# Patient Record
Sex: Male | Born: 1965
Health system: Southern US, Community
[De-identification: ages and names within clinical notes are randomized; demographics above are authoritative.]

## PROBLEM LIST (undated history)

## (undated) DIAGNOSIS — R519 Headache, unspecified: Secondary | ICD-10-CM

## (undated) DIAGNOSIS — I10 Essential (primary) hypertension: Secondary | ICD-10-CM

## (undated) DIAGNOSIS — F329 Major depressive disorder, single episode, unspecified: Secondary | ICD-10-CM

## (undated) DIAGNOSIS — E119 Type 2 diabetes mellitus without complications: Secondary | ICD-10-CM

## (undated) DIAGNOSIS — G459 Transient cerebral ischemic attack, unspecified: Secondary | ICD-10-CM

## (undated) DIAGNOSIS — G4730 Sleep apnea, unspecified: Secondary | ICD-10-CM

## (undated) DIAGNOSIS — F32A Depression, unspecified: Secondary | ICD-10-CM

## (undated) DIAGNOSIS — E7800 Pure hypercholesterolemia, unspecified: Secondary | ICD-10-CM

---

## 2008-11-12 ENCOUNTER — Emergency Department (HOSPITAL_BASED_OUTPATIENT_CLINIC_OR_DEPARTMENT_OTHER): Admission: EM | Admit: 2008-11-12 | Discharge: 2008-11-13 | Payer: Self-pay | Admitting: Emergency Medicine

## 2008-11-12 ENCOUNTER — Ambulatory Visit: Payer: Self-pay | Admitting: Diagnostic Radiology

## 2008-11-16 ENCOUNTER — Ambulatory Visit: Payer: Self-pay | Admitting: Diagnostic Radiology

## 2008-11-16 ENCOUNTER — Ambulatory Visit (HOSPITAL_BASED_OUTPATIENT_CLINIC_OR_DEPARTMENT_OTHER): Admission: RE | Admit: 2008-11-16 | Discharge: 2008-11-16 | Payer: Self-pay | Admitting: Emergency Medicine

## 2010-11-08 LAB — CBC
HCT: 42 % (ref 39.0–52.0)
Hemoglobin: 14 g/dL (ref 13.0–17.0)
MCHC: 33.2 g/dL (ref 30.0–36.0)
MCV: 85 fL (ref 78.0–100.0)
Platelets: 225 10*3/uL (ref 150–400)
RBC: 4.95 MIL/uL (ref 4.22–5.81)
RDW: 13.1 % (ref 11.5–15.5)
WBC: 9.9 10*3/uL (ref 4.0–10.5)

## 2010-11-08 LAB — POCT CARDIAC MARKERS
CKMB, poc: 1 ng/mL (ref 1.0–8.0)
CKMB, poc: <1 ng/mL — ABNORMAL LOW (ref 1.0–8.0)
Myoglobin, poc: 42.7 ng/mL (ref 12–200)
Myoglobin, poc: 48.5 ng/mL (ref 12–200)
Troponin i, poc: <0.05 ng/mL (ref 0.00–0.09)
Troponin i, poc: <0.05 ng/mL (ref 0.00–0.09)

## 2010-11-08 LAB — DIFFERENTIAL
Eosinophils Absolute: 0.4 10*3/uL (ref 0.0–0.7)
Eosinophils Relative: 4 % (ref 0–5)
Lymphs Abs: 2.9 10*3/uL (ref 0.7–4.0)
Monocytes Relative: 7 % (ref 3–12)

## 2010-11-08 LAB — BASIC METABOLIC PANEL
Chloride: 101 mEq/L (ref 96–112)
GFR calc Af Amer: >60 mL/min (ref >60)
Potassium: 3.5 mEq/L (ref 3.5–5.1)

## 2017-06-05 ENCOUNTER — Encounter (HOSPITAL_BASED_OUTPATIENT_CLINIC_OR_DEPARTMENT_OTHER): Payer: Self-pay | Admitting: *Deleted

## 2017-06-05 ENCOUNTER — Emergency Department (HOSPITAL_BASED_OUTPATIENT_CLINIC_OR_DEPARTMENT_OTHER): Payer: BC Managed Care – PPO

## 2017-06-05 ENCOUNTER — Emergency Department (HOSPITAL_BASED_OUTPATIENT_CLINIC_OR_DEPARTMENT_OTHER)
Admission: EM | Admit: 2017-06-05 | Discharge: 2017-06-05 | Disposition: A | Discharge status: Home / Self Care | Payer: BC Managed Care – PPO | Attending: Emergency Medicine | Admitting: Emergency Medicine

## 2017-06-05 DIAGNOSIS — Z7982 Long term (current) use of aspirin: Secondary | ICD-10-CM | POA: Diagnosis not present

## 2017-06-05 DIAGNOSIS — N3001 Acute cystitis with hematuria: Secondary | ICD-10-CM | POA: Diagnosis not present

## 2017-06-05 DIAGNOSIS — R1032 Left lower quadrant pain: Secondary | ICD-10-CM | POA: Diagnosis present

## 2017-06-05 DIAGNOSIS — N200 Calculus of kidney: Secondary | ICD-10-CM | POA: Diagnosis not present

## 2017-06-05 DIAGNOSIS — Z79899 Other long term (current) drug therapy: Secondary | ICD-10-CM | POA: Insufficient documentation

## 2017-06-05 DIAGNOSIS — E7800 Pure hypercholesterolemia, unspecified: Secondary | ICD-10-CM | POA: Diagnosis not present

## 2017-06-05 HISTORY — DX: Major depressive disorder, single episode, unspecified: F32.9

## 2017-06-05 HISTORY — DX: Pure hypercholesterolemia, unspecified: E78.00

## 2017-06-05 HISTORY — DX: Depression, unspecified: F32.A

## 2017-06-05 LAB — URINALYSIS, ROUTINE W REFLEX MICROSCOPIC
Bilirubin Urine: NEGATIVE
Glucose, UA: NEGATIVE mg/dL
KETONES UR: NEGATIVE mg/dL
LEUKOCYTES UA: NEGATIVE
NITRITE: NEGATIVE
PH: 6 (ref 5.0–8.0)
Protein, ur: 100 mg/dL — AB

## 2017-06-05 LAB — URINALYSIS, MICROSCOPIC (REFLEX)

## 2017-06-05 LAB — BASIC METABOLIC PANEL
ANION GAP: 9 (ref 5–15)
BUN: 13 mg/dL (ref 6–20)
CHLORIDE: 106 mmol/L (ref 101–111)
CO2: 22 mmol/L (ref 22–32)
Calcium: 9 mg/dL (ref 8.9–10.3)
Creatinine, Ser: 0.92 mg/dL (ref 0.61–1.24)
GFR calc non Af Amer: >60 mL/min (ref >60)
Glucose, Bld: 135 mg/dL — ABNORMAL HIGH (ref 65–99)
POTASSIUM: 4.1 mmol/L (ref 3.5–5.1)
Sodium: 137 mmol/L (ref 135–145)

## 2017-06-05 MED ORDER — TAMSULOSIN HCL 0.4 MG PO CAPS
0.4000 mg | ORAL_CAPSULE | Freq: Every day | ORAL | Status: DC
  Administered 2017-06-05: 0.4 mg via ORAL
  Filled 2017-06-05: qty 1

## 2017-06-05 MED ORDER — HYDROCODONE-ACETAMINOPHEN 5-325 MG PO TABS: 1.0000 | ORAL_TABLET | Freq: Four times a day (QID) | ORAL | 0 refills | Status: DC | PRN

## 2017-06-05 MED ORDER — ONDANSETRON HCL 4 MG/2ML IJ SOLN
4.0000 mg | Freq: Once | INTRAMUSCULAR | Status: AC
  Administered 2017-06-05: 4 mg via INTRAVENOUS
  Filled 2017-06-05: qty 2

## 2017-06-05 MED ORDER — CEPHALEXIN 250 MG PO CAPS
500.0000 mg | ORAL_CAPSULE | Freq: Once | ORAL | Status: AC
  Administered 2017-06-05: 500 mg via ORAL
  Filled 2017-06-05: qty 2

## 2017-06-05 MED ORDER — SODIUM CHLORIDE 0.9 % IV BOLUS (SEPSIS)
500.0000 mL | Freq: Once | INTRAVENOUS | Status: AC
  Administered 2017-06-05: 500 mL via INTRAVENOUS

## 2017-06-05 MED ORDER — CEPHALEXIN 500 MG PO CAPS: 500.0000 mg | ORAL_CAPSULE | Freq: Four times a day (QID) | ORAL | 0 refills | Status: DC

## 2017-06-05 MED ORDER — KETOROLAC TROMETHAMINE 30 MG/ML IJ SOLN
15.0000 mg | Freq: Once | INTRAMUSCULAR | Status: AC
  Administered 2017-06-05: 15 mg via INTRAVENOUS
  Filled 2017-06-05: qty 1

## 2017-06-05 MED ORDER — FENTANYL CITRATE (PF) 100 MCG/2ML IJ SOLN: 50.0000 ug | Freq: Once | INTRAMUSCULAR | Status: DC

## 2017-06-05 MED ORDER — TAMSULOSIN HCL 0.4 MG PO CAPS: 0.4000 mg | ORAL_CAPSULE | Freq: Every day | ORAL | 0 refills | Status: AC

## 2017-06-05 MED ORDER — DICLOFENAC SODIUM ER 100 MG PO TB24: 100.0000 mg | ORAL_TABLET | Freq: Every day | ORAL | 0 refills | Status: DC

## 2017-06-05 MED FILL — CEPHALEXIN 500 MG CAPSULE: 500 | 7 days supply | Qty: 28 | Fill #0

## 2017-06-05 MED FILL — TAMSULOSIN HCL 0.4 MG CAP: 0.4 | 30 days supply | Qty: 30 | Fill #0

## 2017-06-05 MED FILL — HYDROCODON-APAP 5-325: 5-325 | 3 days supply | Qty: 11 | Fill #0

## 2017-06-05 MED FILL — DICLOFENAC SOD ER 100 MG TA: 100 | 10 days supply | Qty: 10 | Fill #0

## 2017-06-05 NOTE — ED Provider Notes (Signed)
[facility] EMERGENCY DEPARTMENT Provider Note   CSN: [id number] Arrival date & time: 06/05/17  0531     History   Chief Complaint Chief Complaint  Patient presents with  . Abdominal Pain    HPI Nathaniel Hall is a 51 y.o. male.  The history is provided by the patient.  Abdominal Pain   This is a recurrent problem. The current episode started less than 1 hour ago. The problem occurs constantly. The problem has not changed since onset.The pain is associated with an unknown factor. The pain is located in the LLQ. The pain is severe. Pertinent negatives include fever, diarrhea, nausea, dysuria and myalgias. Nothing aggravates the symptoms. Nothing relieves the symptoms. Past workup includes GI consult. His past medical history does not include PUD or ulcerative colitis.    Past Medical History:  Diagnosis Date  . Depression   . Hypercholesteremia     There are no active problems to display for this patient.   History reviewed. No pertinent surgical history.     Home Medications    Prior to Admission medications   Medication Sig Start Date End Date Taking? Authorizing Provider  aspirin 81 MG chewable tablet Chew 81 mg daily by mouth.   Yes [provider]  atorvastatin (LIPITOR) 10 MG tablet Take 10 mg daily by mouth.   Yes [provider]  citalopram (CELEXA) 40 MG tablet Take 40 mg daily by mouth.   Yes [provider]    Family History No family history on file.  Social History Social History   Tobacco Use  . Smoking status: Never Smoker  . Smokeless tobacco: Never Used  Substance Use Topics  . Alcohol use: No    Frequency: Never  . Drug use: No     Allergies   Percocet [oxycodone-acetaminophen]   Review of Systems Review of Systems  Constitutional: Negative for fever.  Respiratory: Negative for shortness of breath.   Cardiovascular: Negative for chest pain.  Gastrointestinal: Positive for abdominal  pain. Negative for diarrhea and nausea.  Genitourinary: Negative for dysuria.  Musculoskeletal: Negative for myalgias.  All other systems reviewed and are negative.    Physical Exam Updated Vital Signs BP (!) 156/74 (BP Location: Right Arm)   Pulse 78   Temp (!) 97.4 F (36.3 C) (Oral)   Resp (!) 24   Ht 5\' 11"  (1.803 m)   Wt 106.6 kg (235 lb)   SpO2 100%   BMI 32.78 kg/m   Physical Exam  Constitutional: He is oriented to person, place, and time. He appears well-developed and well-nourished.  Non-toxic appearance. He does not appear ill. No distress.  HENT:  Head: Normocephalic and atraumatic.  Mouth/Throat: Oropharynx is clear and moist.  Eyes: Pupils are equal, round, and reactive to light.  Cardiovascular: Normal rate, regular rhythm, normal heart sounds and intact distal pulses.  Pulmonary/Chest: Effort normal and breath sounds normal. No stridor. He has no rales.  Abdominal: Soft. Normal appearance and bowel sounds are normal. There is no tenderness. There is no rigidity, no guarding, no tenderness at McBurney's point and negative Murphy's sign. No hernia.  Neurological: He is alert and oriented to person, place, and time.  Skin: Skin is warm and dry. Capillary refill takes less than 2 seconds.  Psychiatric: He has a normal mood and affect.  Nursing note and vitals reviewed.    ED Treatments / Results  Labs (all labs ordered are listed, but only abnormal results are displayed) Labs Reviewed  BASIC METABOLIC PANEL    EKG  EKG Interpretation None       Radiology No results found.  Procedures Procedures (including critical care time)  Medications Ordered in ED Medications  fentaNYL (SUBLIMAZE) injection 50 mcg (not administered)  ondansetron (ZOFRAN) injection 4 mg (4 mg Intravenous Given 06/05/17 0603)  ketorolac (TORADOL) 30 MG/ML injection 15 mg (15 mg Intravenous Given 06/05/17 0603)  sodium chloride 0.9 % bolus 500 mL (500 mLs Intravenous New  Bag/Given 06/05/17 0559)     645 case d/w Dr. Mena GoesEskridge start abx and given return precautions and have patient follow up in the office.    Final Clinical Impressions(s) / ED Diagnoses  All questions answered to the patient's satisfaction.   Strict return precautions for swelling or the lips or tongue, chest pain, dyspnea on exertion, new weakness or numbness changes in vision or speech, fevers, weakness persistent pain, Inability to tolerate liquids or food, changes in voice cough, altered mental status or any concerns. No signs of systemic illness or infection. The patient is nontoxic-appearing on exam and vital signs are within normal limits.    I have reviewed the triage vital signs and the nursing notes. Pertinent labs &imaging results that were available during my care of the patient were reviewed by me and considered in my medical decision making (see chart for details).  After history, exam, and medical workup I feel the patient has been appropriately medically screened and is safe for discharge home. Pertinent diagnoses were discussed with the patient. Patient was given return precautions.   Ashad Fawbush, MD 06/05/17 [phone]

## 2017-06-05 NOTE — ED Triage Notes (Signed)
Pt woke x 20 min PTA with LLQ pain

## 2017-06-08 ENCOUNTER — Encounter (HOSPITAL_BASED_OUTPATIENT_CLINIC_OR_DEPARTMENT_OTHER): Payer: Self-pay | Admitting: Emergency Medicine

## 2018-10-12 IMAGING — CT CT RENAL STONE PROTOCOL
2 of 4 series · 16 of 46 positions shown, 18 images · non-contrast
Comparison: None.

CLINICAL DATA: Both lower quadrant pain

EXAM:
CT ABDOMEN AND PELVIS WITHOUT CONTRAST
TECHNIQUE: Multidetector CT imaging of the abdomen and pelvis was performed
following the standard protocol without IV contrast.

[Series 2: axial st · axial · 0.96mm/px · z∈[-484,-29]mm · 13 of 101 slices shown, 15 images]
[im 5/101  soft-tissue]
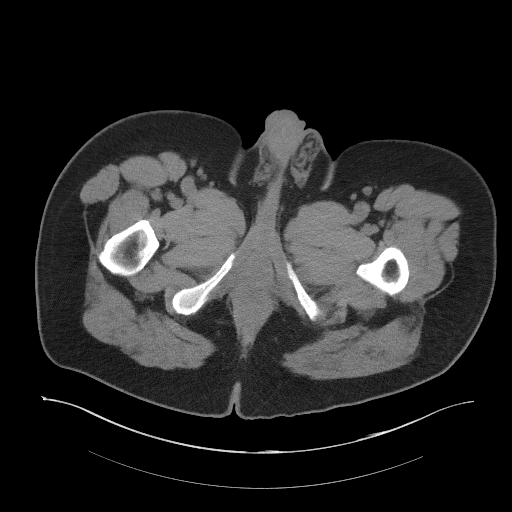
[im 5/101  bone]
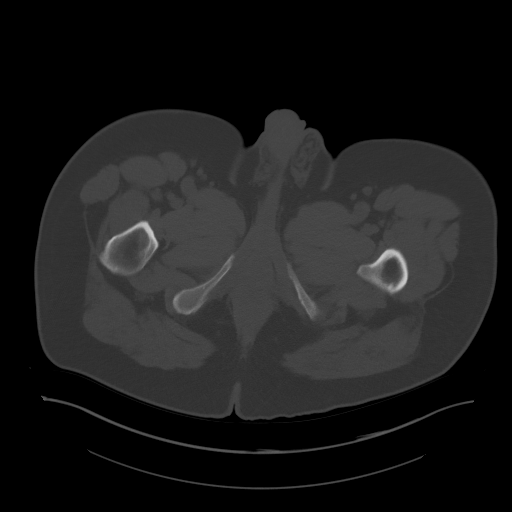
[im 13/101  soft-tissue]
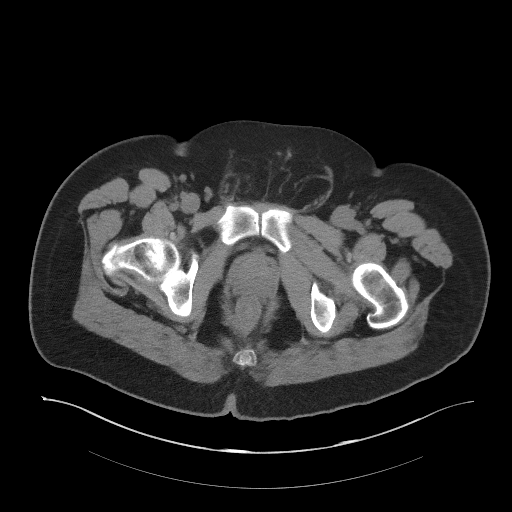
[im 21/101  soft-tissue]
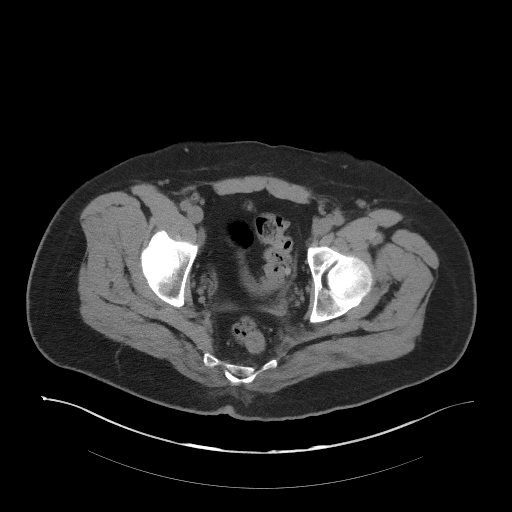
[im 30/101  soft-tissue]
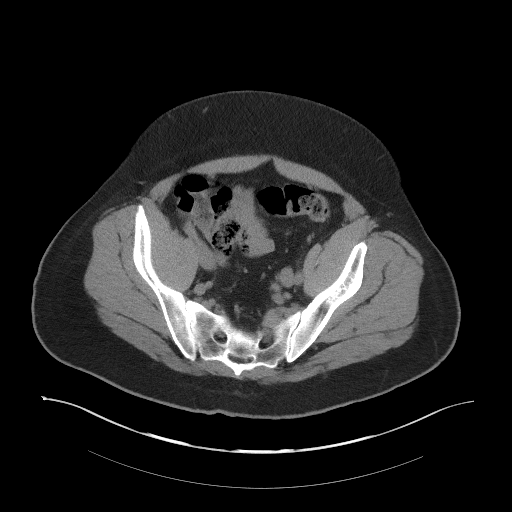
[im 34/101  soft-tissue]
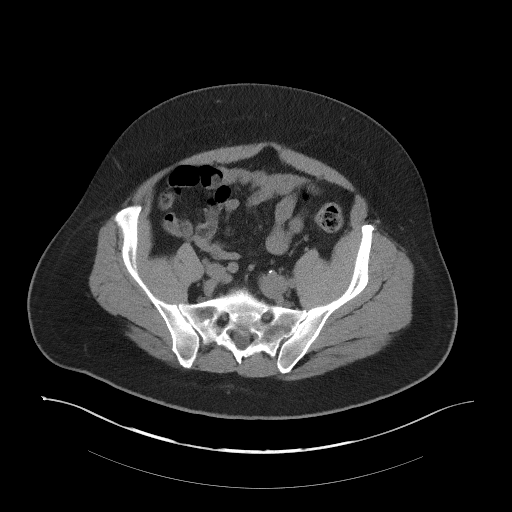
[im 42/101  soft-tissue]
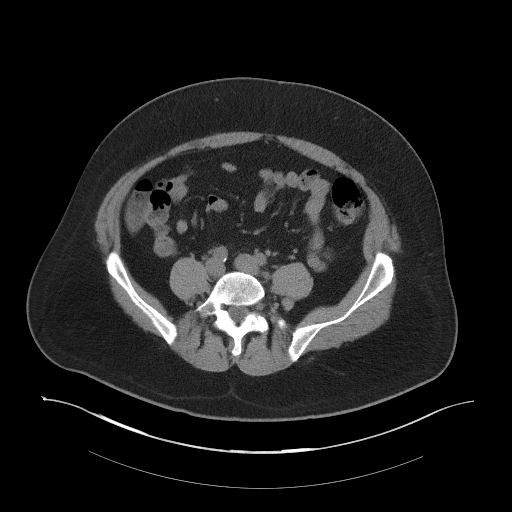
[im 51/101  soft-tissue]
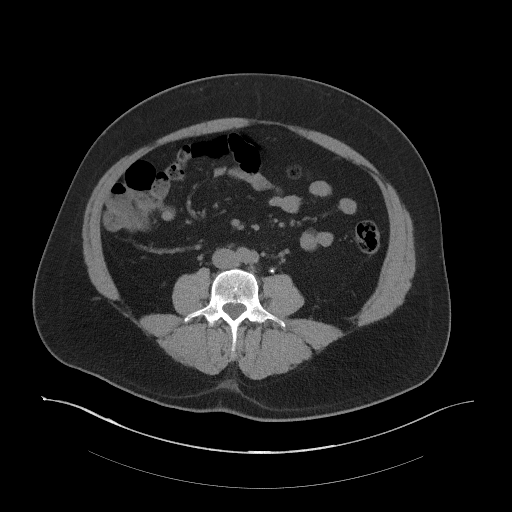
[im 59/101  soft-tissue]
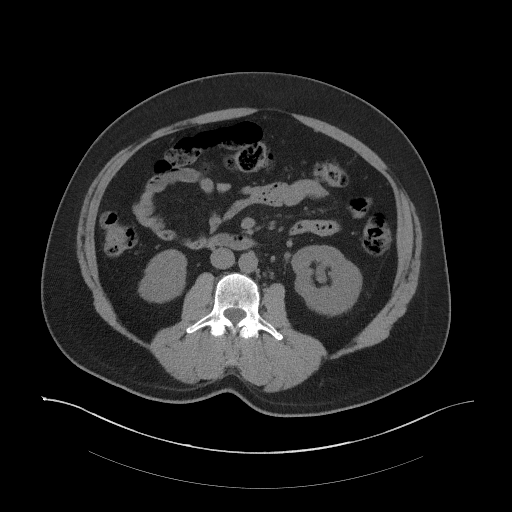
[im 67/101  soft-tissue]
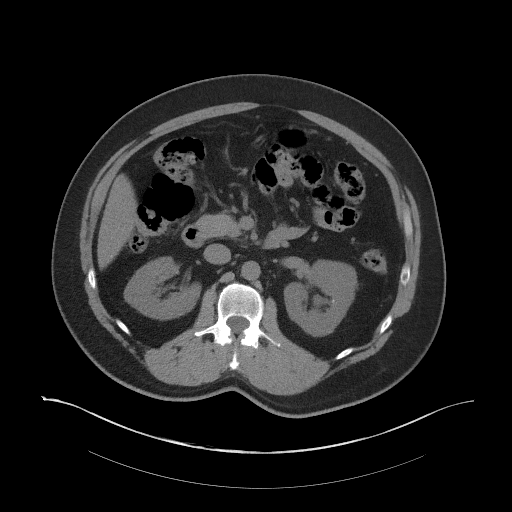
[im 67/101  bone]
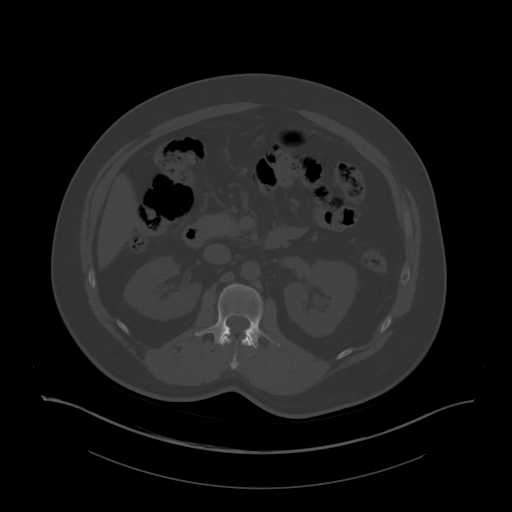
[im 71/101  soft-tissue]
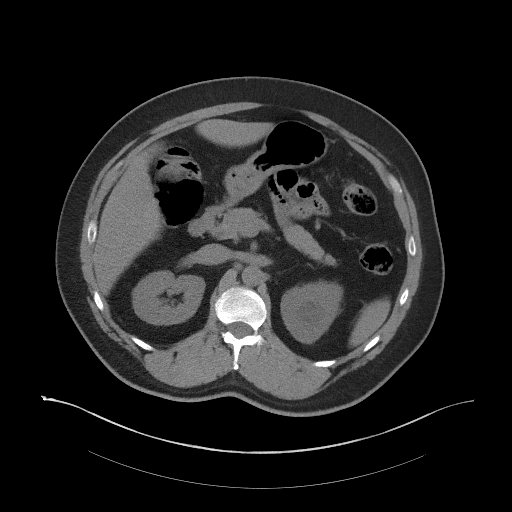
[im 80/101  soft-tissue]
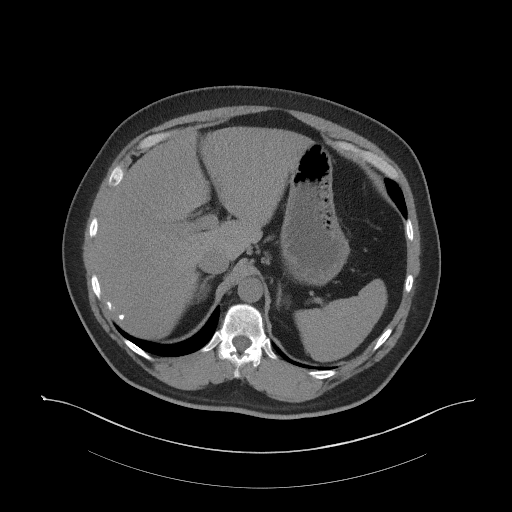
[im 88/101  soft-tissue]
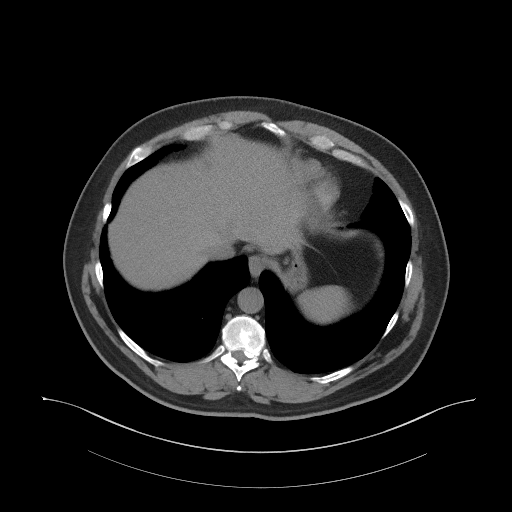
[im 96/101  soft-tissue]
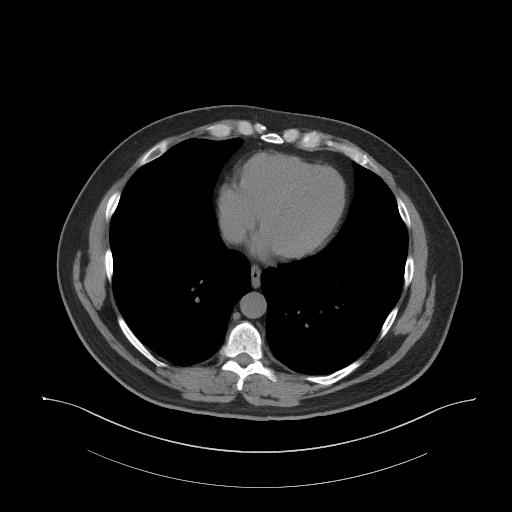

[Series 4: coronal st · coronal · 0.94mm/px · 3 of 89 slices shown]
[im 30/89  soft-tissue]
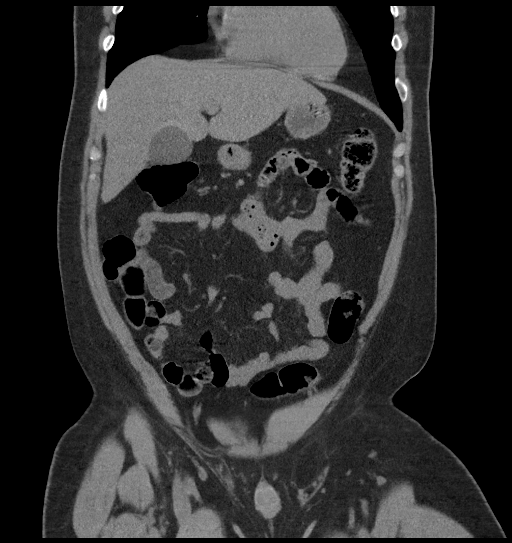
[im 40/89  soft-tissue]
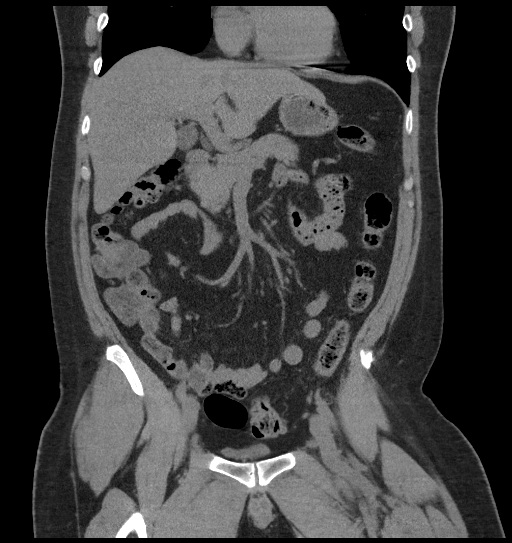
[im 49/89  soft-tissue]
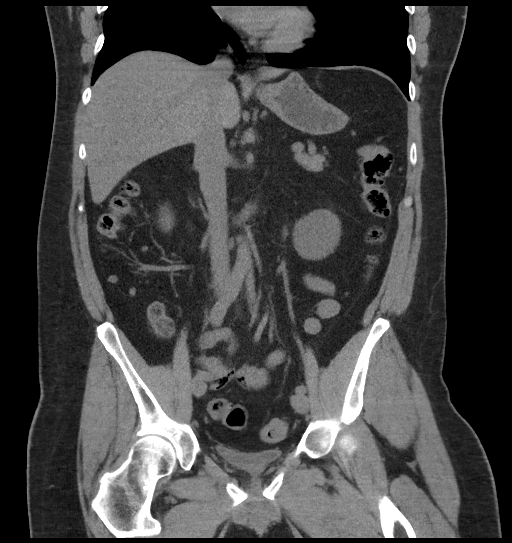

[16 of 46 positions shown; findings below may reference images not displayed]

FINDINGS: Lower chest: No pulmonary nodules or pleural effusion. No visible
pericardial effusion.

Hepatobiliary: Normal hepatic contours and density. No visible
biliary dilatation. Normal gallbladder.

Pancreas: Normal contours without ductal dilatation. No
peripancreatic fluid collection.

Spleen: Normal.

Adrenals/Urinary Tract:

--Adrenal glands: Normal.

--Right kidney/ureter: No hydronephrosis or perinephric stranding.
No nephrolithiasis. No obstructing ureteral stones.

--Left kidney/ureter: There is a 3 x 2 mm stone within the
midportion of the left ureter with resultant mild left
hydronephrosis. The remainder of the left ureter is normal. There
are additional nonobstructing stones within the left kidney
measuring 4 mm in the interpolar region and 3 mm at the upper
portion of the renal pelvis. 4.7 cm left upper pole cyst.

--Urinary bladder: Unremarkable.

Stomach/Bowel:

--Stomach/Duodenum: Normal.

--Small bowel: No dilatation or inflammation.

--Colon: No focal abnormality.

--Appendix: Normal.

Vascular/Lymphatic: Normal course and caliber of the major abdominal
vessels. No abdominal or pelvic lymphadenopathy.

Reproductive: Normal prostate and seminal vesicles.

Musculoskeletal. Status post T10 vertebral augmentation.

Other: None.
IMPRESSION: 1. Left-sided obstructive uropathy with mid ureteral stone measuring
3 x 2 mm causing mild left hydronephrosis.
2. Additional, nonobstructing left renal calculi measuring up to 4
mm.

## 2019-10-08 ENCOUNTER — Ambulatory Visit: Payer: BC Managed Care – PPO | Attending: Internal Medicine

## 2019-10-08 DIAGNOSIS — Z23 Encounter for immunization: Secondary | ICD-10-CM

## 2019-10-08 NOTE — Progress Notes (Signed)
   Covid-19 Vaccination Clinic  Name:  Nathaniel Hall    MRN: [id number] DOB: 24-Oct-1965  10/08/2019  Nathaniel Hall was observed post Covid-19 immunization for 15 minutes without incident. He was provided with Vaccine Information Sheet and instruction to access the V-Safe system.   Nathaniel Hall was instructed to call 911 with any severe reactions post vaccine: Marland Kitchen Difficulty breathing  . Swelling of face and throat  . A fast heartbeat  . A bad rash all over body  . Dizziness and weakness   Immunizations Administered    Name Date Dose VIS Date Route   Pfizer COVID-19 Vaccine 10/08/2019  1:37 PM 0.3 mL 07/10/2019 Intramuscular   Manufacturer: [employer], [employer]   Lot: [id number]   NDC: [address]-7

## 2019-11-02 ENCOUNTER — Ambulatory Visit: Payer: BC Managed Care – PPO | Attending: Internal Medicine

## 2019-11-02 DIAGNOSIS — Z23 Encounter for immunization: Secondary | ICD-10-CM

## 2019-11-02 NOTE — Progress Notes (Signed)
   Covid-19 Vaccination Clinic  Name:  Nathaniel Hall    MRN: [id number] DOB: June 21, 1966  11/02/2019  Nathaniel Hall was observed post Covid-19 immunization for 15 minutes without incident. He was provided with Vaccine Information Sheet and instruction to access the V-Safe system.   Nathaniel Hall was instructed to call 911 with any severe reactions post vaccine: Marland Kitchen Difficulty breathing  . Swelling of face and throat  . A fast heartbeat  . A bad rash all over body  . Dizziness and weakness   Immunizations Administered    Name Date Dose VIS Date Route   Pfizer COVID-19 Vaccine 11/02/2019 11:27 AM 0.3 mL 07/10/2019 Intramuscular   Manufacturer: [employer], [employer]   Lot: [phone] [id number]   NDC: [address]-4

## 2020-03-04 ENCOUNTER — Other Ambulatory Visit: Payer: Self-pay | Admitting: Urology

## 2020-03-04 ENCOUNTER — Other Ambulatory Visit (HOSPITAL_COMMUNITY)
Admission: RE | Admit: 2020-03-04 | Discharge: 2020-03-04 | Disposition: A | Discharge status: Home / Self Care | Payer: BC Managed Care – PPO | Source: Ambulatory Visit | Attending: Urology | Admitting: Urology

## 2020-03-04 DIAGNOSIS — Z01812 Encounter for preprocedural laboratory examination: Secondary | ICD-10-CM | POA: Diagnosis present

## 2020-03-04 DIAGNOSIS — Z20822 Contact with and (suspected) exposure to covid-19: Secondary | ICD-10-CM | POA: Diagnosis not present

## 2020-03-04 LAB — SARS CORONAVIRUS 2 (TAT 6-24 HRS): SARS Coronavirus 2: NEGATIVE

## 2020-03-04 NOTE — H&P (Signed)
Office Visit Report     03/03/2020   --------------------------------------------------------------------------------   Nathaniel Hall  MRN: [id number]  DOB: 09/14/1965, 54 year old Male  SSN:    PRIMARY CARE:  [facility]  REFERRING:  April K. Terressa Koyanagi, MD  PROVIDER:  Anne Fu, NP  LOCATION:  [facility] [phone]     --------------------------------------------------------------------------------   CC/HPI: 02/24/2020:  Nathaniel Hall is a 54 year old patient of Dr. Mena Goes. He has a known history of bilateral nonobstructing renal stones based on recent ultrasonography in May. He also underwent cystoscopy earlier this month for microscopic hematuria at which time he had 3-10 red blood cells. No significant pathology was noted on cystoscopy it was felt that his hematuria was likely related to his stone disease. Over the last 48 hours, he has developed severe right lower quadrant pain. This has been associated with nausea. He has had low-grade temperature to 99 but no high-grade fever.   03/03/2020: CT imaging performed at time of last office visit indicated an obstructing right mid ureteral calculi. Patient was interested in shockwave lithotripsy for treatment but due to stones location it was thought this was not an appropriate treatment modality. Medical expulsive therapy initiated. He returns today for follow-up exam with KUB.   He has not seen any interval stone material passage. He does continue tamsulosin and notes an improvement with force of stream in regards to this. No significantly worsening of baseline frequency/urgency. He has not had any interval burning or painful urination, visible blood in the urine. Pain and discomfort as well as nausea have continue much less severe more intermittent not requiring the use of any type of antiemetic or pain medication since time of last office visit. He denies interval fevers or chills.     ALLERGIES: Percocet  - "makes pain worse per pt"    MEDICATIONS: Hydrocodone-Acetaminophen 5 mg-325 mg tablet 1-2 tablet PO Q 6 H prn  Metoprolol Tartrate 25 mg tablet  Tamsulosin Hcl 0.4 mg capsule 1 capsule PO Q HS  Tamsulosin Hcl 0.4 mg capsule  Aspirin Ec 81 mg tablet, delayed release  Duloxetine Hcl 60 mg capsule,delayed release  Indapamide 1.25 mg tablet  Relpax PRN  Vitamin D3     GU PSH: Cystoscopy - 02/04/2020       PSH Notes: oral surgery     NON-GU PSH: Back surgery Colonoscopy     GU PMH: RLQ pain - 02/24/2020 Ureteral calculus - 02/24/2020 Microscopic hematuria, benign eval - 02/04/2020 Gross hematuria - 12/09/2019 Pelvic/perineal pain - 10/23/2019, - 04/08/2019, - 03/06/2019, - 02/17/2019, - 02/02/2019 Microscopic hematuria - 02/02/2019 Renal calculus - 2018 Renal and ureteral calculus, Left - 2018 Renal cyst - 2018      PMH Notes: hepatitis     NON-GU PMH: Hypercalciuria - 2018 Anxiety Depression Hypercholesterolemia Other migraine, intractable, with status migrainosus Sleep Apnea Stroke/TIA    FAMILY HISTORY: Death - Mother Heart Attack - Mother, Father skin cancer - Mother   SOCIAL HISTORY: Marital Status: Married Preferred Language: English; Ethnicity: Not Hispanic Or Latino; Race: White Current Smoking Status: Patient has never smoked.   Tobacco Use Assessment Completed: Used Tobacco in last 30 days? Drinks 1 drink per month.  Drinks 1 caffeinated drink per day. 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     Notes: 2 daughters    REVIEW OF SYSTEMS:    GU Review Male:   Patient reports stream starts and stops and trouble starting your stream. Patient denies frequent urination, hard to  postpone urination, burning/ pain with urination, get up at night to urinate, leakage of urine, have to strain to urinate , erection problems, and penile pain.  Gastrointestinal (Upper):   Patient reports nausea. Patient denies vomiting and indigestion/ heartburn.  Gastrointestinal (Lower):    Patient denies diarrhea and constipation.  Constitutional:   Patient denies fever, night sweats, weight loss, and fatigue.  Skin:   Patient denies itching and skin rash/ lesion.  Eyes:   Patient denies blurred vision and double vision.  Ears/ Nose/ Throat:   Patient denies sore throat and sinus problems.  Hematologic/Lymphatic:   Patient denies swollen glands and easy bruising.  Cardiovascular:   Patient denies leg swelling and chest pains.  Respiratory:   Patient denies cough and shortness of breath.  Endocrine:   Patient denies excessive thirst.  Musculoskeletal:   Patient reports joint pain. Patient denies back pain.  Neurological:   Patient reports headaches and dizziness.   Psychologic:   Patient denies depression and anxiety.   VITAL SIGNS:      03/03/2020 09:20 AM  Weight 238 lb / 107.95 kg  Height 71 in / 180.34 cm  BP 135/84 mmHg  Heart Rate 77 /min  Temperature 98.9 F / 37.1 C  BMI 33.2 kg/m   MULTI-SYSTEM PHYSICAL EXAMINATION:    Constitutional: Well-nourished. No physical deformities. Normally developed. Good grooming.  Neck: Neck symmetrical, not swollen. Normal tracheal position.  Respiratory: No labored breathing, no use of accessory muscles.   Cardiovascular: Normal temperature, normal extremity pulses, no swelling, no varicosities.  Skin: No paleness, no jaundice, no cyanosis. No lesion, no ulcer, no rash.  Neurologic / Psychiatric: Oriented to time, oriented to place, oriented to person. No depression, no anxiety, no agitation.  Gastrointestinal: Obese abdomen. No mass, no tenderness, no rigidity.   Musculoskeletal: Normal gait and station of head and neck.     Complexity of Data:  Source Of History:  Patient, Medical Record Summary  Records Review:   Previous Doctor Records, Previous Hospital Records, Previous Patient Records  Urine Test Review:   Urinalysis, Urine Culture  X-Ray Review: KUB: Reviewed Films. Discussed With Patient.  C.T. Stone Protocol:  Reviewed Films. Reviewed Report.     03/03/20  Urinalysis  Urine Appearance Clear   Urine Color Yellow   Urine Glucose Neg mg/dL  Urine Bilirubin Neg mg/dL  Urine Ketones Neg mg/dL  Urine Specific Gravity 1.025   Urine Blood Neg ery/uL  Urine pH 6.0   Urine Protein Trace mg/dL  Urine Urobilinogen 0.2 mg/dL  Urine Nitrites Neg   Urine Leukocyte Esterase Neg leu/uL   PROCEDURES:         KUB - [id number]  A single view of the abdomen is obtained. An opacity of similar size/shape of the previously identified right mid ureteral calculi is noted overlying the midportion of the right sacral wing. Grossly unchanged in location. The remaining anatomical expected tract of the right distal ureter is not well visualized due to a prominent overlying bowel gas and stool pattern within the pelvic ring. Additional ureteral calculi can't be positively identified distally due to this.      Patient confirmed No Neulasta OnPro Device.           Urinalysis Dipstick Dipstick Cont'd  Color: Yellow Bilirubin: Neg mg/dL  Appearance: Clear Ketones: Neg mg/dL  Specific Gravity: 4.481 Blood: Neg ery/uL  pH: 6.0 Protein: Trace mg/dL  Glucose: Neg mg/dL Urobilinogen: 0.2 mg/dL    Nitrites: Neg    Leukocyte  Esterase: Neg leu/uL    ASSESSMENT:      ICD-10 Details  1 GU:   Ureteral calculus - N20.1 Right, Acute, Systemic Symptoms  2   Renal calculus - N20.0 Left, Chronic, Stable   PLAN:           Orders Labs Urine Culture          Schedule Return Visit/Planned Activity: Other See Visit Notes - Follow up MD, Schedule Surgery          Document Letter(s):  Created for Patient: Clinical Summary         Notes:   Larina Bras remains plainly visible on KUB today overlying the right sacral wing. Fortunately he has only been intermittently, mildly symptomatic since time last office visit. He continues tamsulosin. Based on stone size I am not sure if patient will be able to successfully pass with continued medical  expulsive therapy. We discussed definitive treatment options. Based on the stones location overlying the bony structures, I have some reservations about proceeding with shockwave lithotripsy although that would be the patient's preference. We discussed ureteroscopy as well.   We discussed ureteroscopic stone manipulation with basketing and laser-lithotripsy in detail. We discussed risks including bleeding, infection, damage to kidney / ureter bladder, rarely loss of kidney. We discussed anesthetic risks and rare but serious surgical complications including DVT, PE, MI, and mortality. We specifically addressed that in 5-10% of cases a staged approach is required with stenting followed by re-attempt ureteroscopy if anatomy unfavorable.   I have sent a message to Dr Mena Goes for review. The patient will be contacted with recommended plan of care moving forward. He will continue tamsulosin, remaining liberally hydrated straining his urine. In the event he does pass the stone he will contact the office to be scheduled for further evaluation. He has pain medication on hand as well as antinausea medication to use if needed. Precautionary urine culture sent today to serve as baseline prior to pending follow-up or procedure intervention.    * Signed by Anne Fu, NP on 03/03/20 at 9:41 AM (EDT*     The information contained in this medical record document is considered private and confidential patient information. This information can only be used for the medical diagnosis and/or medical services that are being provided by the patient's selected caregivers. This information can only be distributed outside of the patient's care if the patient agrees and signs waivers of authorization for this information to be sent to an outside source or route.

## 2020-03-04 NOTE — Progress Notes (Signed)
Patient to arrive at 0915. History and medications reviewed. Pre-procedure instructions given. NPO after MN. Norvasc with sip of water in am. Driver secured.

## 2020-03-07 ENCOUNTER — Encounter (HOSPITAL_BASED_OUTPATIENT_CLINIC_OR_DEPARTMENT_OTHER)
Admission: RE | Disposition: A | Discharge status: Home / Self Care | Payer: Self-pay | Source: Home / Self Care | Attending: Urology

## 2020-03-07 ENCOUNTER — Ambulatory Visit (HOSPITAL_BASED_OUTPATIENT_CLINIC_OR_DEPARTMENT_OTHER)
Admission: RE | Admit: 2020-03-07 | Discharge: 2020-03-07 | Disposition: A | Discharge status: Home / Self Care | Payer: BC Managed Care – PPO | Attending: Urology | Admitting: Urology

## 2020-03-07 ENCOUNTER — Ambulatory Visit (HOSPITAL_COMMUNITY): Payer: BC Managed Care – PPO

## 2020-03-07 ENCOUNTER — Encounter (HOSPITAL_BASED_OUTPATIENT_CLINIC_OR_DEPARTMENT_OTHER): Payer: Self-pay | Admitting: Urology

## 2020-03-07 DIAGNOSIS — Z8249 Family history of ischemic heart disease and other diseases of the circulatory system: Secondary | ICD-10-CM | POA: Diagnosis not present

## 2020-03-07 DIAGNOSIS — G4730 Sleep apnea, unspecified: Secondary | ICD-10-CM | POA: Insufficient documentation

## 2020-03-07 DIAGNOSIS — F419 Anxiety disorder, unspecified: Secondary | ICD-10-CM | POA: Insufficient documentation

## 2020-03-07 DIAGNOSIS — Z87442 Personal history of urinary calculi: Secondary | ICD-10-CM | POA: Insufficient documentation

## 2020-03-07 DIAGNOSIS — N201 Calculus of ureter: Secondary | ICD-10-CM

## 2020-03-07 DIAGNOSIS — Z8673 Personal history of transient ischemic attack (TIA), and cerebral infarction without residual deficits: Secondary | ICD-10-CM | POA: Diagnosis not present

## 2020-03-07 DIAGNOSIS — Z885 Allergy status to narcotic agent status: Secondary | ICD-10-CM | POA: Diagnosis not present

## 2020-03-07 DIAGNOSIS — G43909 Migraine, unspecified, not intractable, without status migrainosus: Secondary | ICD-10-CM | POA: Insufficient documentation

## 2020-03-07 DIAGNOSIS — Z79899 Other long term (current) drug therapy: Secondary | ICD-10-CM | POA: Insufficient documentation

## 2020-03-07 DIAGNOSIS — I10 Essential (primary) hypertension: Secondary | ICD-10-CM | POA: Diagnosis not present

## 2020-03-07 DIAGNOSIS — Z7982 Long term (current) use of aspirin: Secondary | ICD-10-CM | POA: Diagnosis not present

## 2020-03-07 DIAGNOSIS — F329 Major depressive disorder, single episode, unspecified: Secondary | ICD-10-CM | POA: Diagnosis not present

## 2020-03-07 HISTORY — PX: EXTRACORPOREAL SHOCK WAVE LITHOTRIPSY: SHX1557

## 2020-03-07 SURGERY — LITHOTRIPSY, ESWL
Anesthesia: LOCAL | Laterality: Right

## 2020-03-07 MED ORDER — DIAZEPAM 5 MG PO TABS
ORAL_TABLET | ORAL | Status: AC
  Filled 2020-03-07: qty 2

## 2020-03-07 MED ORDER — DIAZEPAM 5 MG PO TABS
10.0000 mg | ORAL_TABLET | ORAL | Status: AC
  Administered 2020-03-07: 10 mg via ORAL

## 2020-03-07 MED ORDER — CIPROFLOXACIN HCL 500 MG PO TABS
500.0000 mg | ORAL_TABLET | ORAL | Status: AC
  Administered 2020-03-07: 500 mg via ORAL

## 2020-03-07 MED ORDER — DIPHENHYDRAMINE HCL 25 MG PO CAPS
25.0000 mg | ORAL_CAPSULE | ORAL | Status: AC
  Administered 2020-03-07: 25 mg via ORAL

## 2020-03-07 MED ORDER — DIPHENHYDRAMINE HCL 25 MG PO CAPS
ORAL_CAPSULE | ORAL | Status: AC
  Filled 2020-03-07: qty 1

## 2020-03-07 MED ORDER — CIPROFLOXACIN HCL 500 MG PO TABS
ORAL_TABLET | ORAL | Status: AC
  Filled 2020-03-07: qty 1

## 2020-03-07 MED ORDER — SODIUM CHLORIDE 0.9 % IV SOLN: INTRAVENOUS | Status: DC

## 2020-03-07 NOTE — Discharge Instructions (Signed)
Lithotripsy, Care After This sheet gives you information about how to care for yourself after your procedure. Your health care provider may also give you more specific instructions. If you have problems or questions, contact your health care provider. What can I expect after the procedure? After the procedure, it is common to have:  Some blood in your urine. This should only last for a few days.  Soreness in your back, sides, or upper abdomen for a few days.  Blotches or bruises on your back where the pressure wave entered the skin.  Pain, discomfort, or nausea when pieces (fragments) of the kidney stone move through the tube that carries urine from the kidney to the bladder (ureter). Stone fragments may pass soon after the procedure, but they may continue to pass for up to 4-8 weeks. ? If you have severe pain or nausea, contact your health care provider. This may be caused by a large stone that was not broken up, and this may mean that you need more treatment.  Some pain or discomfort during urination.  Some pain or discomfort in the lower abdomen or (in men) at the base of the penis. Follow these instructions at home: Medicines  Take over-the-counter and prescription medicines only as told by your health care provider.  If you were prescribed an antibiotic medicine, take it as told by your health care provider. Do not stop taking the antibiotic even if you start to feel better.  Do not drive for 24 hours if you were given a medicine to help you relax (sedative).  Do not drive or use heavy machinery while taking prescription pain medicine. Eating and drinking      Drink enough water and fluids to keep your urine clear or pale yellow. This helps any remaining pieces of the stone to pass. It can also help prevent new stones from forming.  Eat plenty of fresh fruits and vegetables.  Follow instructions from your health care provider about eating and drinking restrictions. You may be  instructed: ? To reduce how much salt (sodium) you eat or drink. Check ingredients and nutrition facts on packaged foods and beverages. ? To reduce how much meat you eat.  Eat the recommended amount of calcium for your age and gender. Ask your health care provider how much calcium you should have. General instructions  Get plenty of rest.  Most people can resume normal activities 1-2 days after the procedure. Ask your health care provider what activities are safe for you.  Your health care provider may direct you to lie in a certain position (postural drainage) and tap firmly (percuss) over your kidney area to help stone fragments pass. Follow instructions as told by your health care provider.  If directed, strain all urine through the strainer that was provided by your health care provider. ? Keep all fragments for your health care provider to see. Any stones that are found may be sent to a medical lab for examination. The stone may be as small as a grain of salt.  Keep all follow-up visits as told by your health care provider. This is important. Contact a health care provider if:  You have pain that is severe or does not get better with medicine.  You have nausea that is severe or does not go away.  You have blood in your urine longer than your health care provider told you to expect.  You have more blood in your urine.  You have pain during urination that does   not go away.  You urinate more frequently than usual and this does not go away.  You develop a rash or any other possible signs of an allergic reaction. Get help right away if:  You have severe pain in your back, sides, or upper abdomen.  You have severe pain while urinating.  Your urine is very dark red.  You have blood in your stool (feces).  You cannot pass any urine at all.  You feel a strong urge to urinate after emptying your bladder.  You have a fever or chills.  You develop shortness of breath,  difficulty breathing, or chest pain.  You have severe nausea that leads to persistent vomiting.  You faint. Summary  After this procedure, it is common to have some pain, discomfort, or nausea when pieces (fragments) of the kidney stone move through the tube that carries urine from the kidney to the bladder (ureter). If this pain or nausea is severe, however, you should contact your health care provider.  Most people can resume normal activities 1-2 days after the procedure. Ask your health care provider what activities are safe for you.  Drink enough water and fluids to keep your urine clear or pale yellow. This helps any remaining pieces of the stone to pass, and it can help prevent new stones from forming.  If directed, strain your urine and keep all fragments for your health care provider to see. Fragments or stones may be as small as a grain of salt.  Get help right away if you have severe pain in your back, sides, or upper abdomen or have severe pain while urinating. This information is not intended to replace advice given to you by your health care provider. Make sure you discuss any questions you have with your health care provider. Document Revised: 10/27/2018 Document Reviewed: 06/06/2016 [employer] Patient Education  2020 [employer]    Post Anesthesia Home Care Instructions  Activity: Get plenty of rest for the remainder of the day. A responsible individual must stay with you for 24 hours following the procedure.  For the next 24 hours, DO NOT: -Drive a car -Operate machinery -Drink alcoholic beverages -Take any medication unless instructed by your physician -Make any legal decisions or sign important papers.  Meals: Start with liquid foods such as gelatin or soup. Progress to regular foods as tolerated. Avoid greasy, spicy, heavy foods. If nausea and/or vomiting occur, drink only clear liquids until the nausea and/or vomiting subsides. Call your physician if vomiting  continues.  Special Instructions/Symptoms: Your throat may feel dry or sore from the anesthesia or the breathing tube placed in your throat during surgery. If this causes discomfort, gargle with warm salt water. The discomfort should disappear within 24 hours.  If you had a scopolamine patch placed behind your ear for the management of post- operative nausea and/or vomiting:  1. The medication in the patch is effective for 72 hours, after which it should be removed.  Wrap patch in a tissue and discard in the trash. Wash hands thoroughly with soap and water. 2. You may remove the patch earlier than 72 hours if you experience unpleasant side effects which may include dry mouth, dizziness or visual disturbances. 3. Avoid touching the patch. Wash your hands with soap and water after contact with the patch.     

## 2020-03-07 NOTE — Op Note (Signed)
Right mid ureteral stone 7 mm   Right ESWL   Findings; Stone faintly visible on KUB and visible on flouro on mobile litho unit. Stone faded but he may need a staged procedure if he fails to pass the stone or fragments. He tolerated well.

## 2020-03-07 NOTE — Interval H&P Note (Signed)
History and Physical Interval Note:  03/07/2020 10:32 AM  Nathaniel Hall  has presented today for surgery, with the diagnosis of RIGHT URETERAL STONE.  The various methods of treatment have been discussed with the patient and family. After consideration of risks, benefits and other options for treatment, the patient has consented to  Procedure(s): EXTRACORPOREAL SHOCK WAVE LITHOTRIPSY (ESWL) (Right) as a surgical intervention.  The patient's history has been reviewed, patient examined, no change in status, stable for surgery.  I have reviewed the patient's chart and labs.  Questions were answered to the patient's satisfaction.  He has not seen a stone pass. Some right flank pain. No fever or dysuria. Stone faint over right sacrum and visible on flouro over right sacrum in right mid ureter.    Jerilee Field

## 2020-03-08 ENCOUNTER — Encounter (HOSPITAL_BASED_OUTPATIENT_CLINIC_OR_DEPARTMENT_OTHER): Payer: Self-pay | Admitting: Urology

## 2020-05-13 ENCOUNTER — Emergency Department
Admission: EM | Admit: 2020-05-13 | Discharge: 2020-05-13 | Disposition: A | Discharge status: Home / Self Care | Payer: BLUE CROSS/BLUE SHIELD | Attending: Hospitalist | Admitting: Hospitalist

## 2020-05-13 ENCOUNTER — Emergency Department (HOSPITAL_BASED_OUTPATIENT_CLINIC_OR_DEPARTMENT_OTHER): Payer: BLUE CROSS/BLUE SHIELD

## 2020-05-13 ENCOUNTER — Encounter (HOSPITAL_COMMUNITY): Payer: Self-pay | Admitting: Student in an Organized Health Care Education/Training Program

## 2020-05-13 ENCOUNTER — Telehealth (HOSPITAL_BASED_OUTPATIENT_CLINIC_OR_DEPARTMENT_OTHER): Payer: Self-pay | Admitting: Student in an Organized Health Care Education/Training Program

## 2020-05-13 DIAGNOSIS — J342 Deviated nasal septum: Secondary | ICD-10-CM

## 2020-05-13 DIAGNOSIS — I639 Cerebral infarction, unspecified: Secondary | ICD-10-CM

## 2020-05-13 DIAGNOSIS — G43909 Migraine, unspecified, not intractable, without status migrainosus: Secondary | ICD-10-CM | POA: Insufficient documentation

## 2020-05-13 DIAGNOSIS — G459 Transient cerebral ischemic attack, unspecified: Secondary | ICD-10-CM | POA: Insufficient documentation

## 2020-05-13 DIAGNOSIS — R29810 Facial weakness: Secondary | ICD-10-CM | POA: Insufficient documentation

## 2020-05-13 DIAGNOSIS — R4781 Slurred speech: Secondary | ICD-10-CM

## 2020-05-13 DIAGNOSIS — R110 Nausea: Secondary | ICD-10-CM | POA: Insufficient documentation

## 2020-05-13 DIAGNOSIS — Z8673 Personal history of transient ischemic attack (TIA), and cerebral infarction without residual deficits: Secondary | ICD-10-CM | POA: Insufficient documentation

## 2020-05-13 DIAGNOSIS — R471 Dysarthria and anarthria: Secondary | ICD-10-CM | POA: Insufficient documentation

## 2020-05-13 DIAGNOSIS — R4789 Other speech disturbances: Secondary | ICD-10-CM

## 2020-05-13 DIAGNOSIS — Z20822 Contact with and (suspected) exposure to covid-19: Secondary | ICD-10-CM | POA: Insufficient documentation

## 2020-05-13 DIAGNOSIS — F410 Panic disorder [episodic paroxysmal anxiety] without agoraphobia: Secondary | ICD-10-CM | POA: Insufficient documentation

## 2020-05-13 DIAGNOSIS — I672 Cerebral atherosclerosis: Secondary | ICD-10-CM

## 2020-05-13 DIAGNOSIS — R4701 Aphasia: Secondary | ICD-10-CM | POA: Insufficient documentation

## 2020-05-13 LAB — PROTHROMBIN TIME, BLOOD
INR: 1.2
PT,Patient: 12.7 s — ABNORMAL HIGH (ref 9.7–12.5)

## 2020-05-13 LAB — ECG 12-LEAD
ATRIAL RATE: 81 {beats}/min
ECG INTERPRETATION: NORMAL
P AXIS: 57 degrees
PR INTERVAL: 112 ms
QRS INTERVAL/DURATION: 94 ms
QT: 374 ms
QTc (Bazett): 434 ms
R AXIS: 0 degrees
T AXIS: 110 degrees
VENTRICULAR RATE: 81 {beats}/min

## 2020-05-13 LAB — CBC WITH DIFF, BLOOD
ANC-Automated: 4.4 10*3/uL (ref 1.6–7.0)
Abs Basophils: 0 10*3/uL (ref <0.1)
Abs Eosinophils: 0.3 10*3/uL (ref 0.0–0.5)
Abs Lymphs: 2.4 10*3/uL (ref 0.8–3.1)
Abs Monos: 0.6 10*3/uL (ref 0.2–0.8)
Basophils: 1 %
Eosinophils: 4 %
Hct: 37.9 % — ABNORMAL LOW (ref 40.0–50.0)
Hgb: 12.7 gm/dL — ABNORMAL LOW (ref 13.7–17.5)
Lymphocytes: 31 %
MCH: 28.5 pg (ref 26.0–32.0)
MCHC: 33.5 g/dL (ref 32.0–36.0)
MCV: 85 um3 (ref 79.0–95.0)
MPV: 8.6 fL — ABNORMAL LOW (ref 9.4–12.4)
Monocytes: 7 %
Plt Count: 243 10*3/uL (ref 140–370)
RBC: 4.46 10*6/uL — ABNORMAL LOW (ref 4.60–6.10)
RDW: 13.1 % (ref 12.0–14.0)
Segs: 57 %
WBC: 7.7 10*3/uL (ref 4.0–10.0)

## 2020-05-13 LAB — BASIC METABOLIC PANEL, BLOOD
Anion Gap: 12 mmol/L (ref 7–15)
BUN: 16 mg/dL (ref 6–20)
Bicarbonate: 24 mmol/L (ref 22–29)
Calcium: 8.8 mg/dL (ref 8.5–10.6)
Chloride: 99 mmol/L (ref 98–107)
Creatinine: 0.8 mg/dL (ref 0.67–1.17)
GFR: >60 mL/min
Glucose: 144 mg/dL — ABNORMAL HIGH (ref 70–99)
Potassium: 3.8 mmol/L (ref 3.5–5.1)
Sodium: 135 mmol/L — ABNORMAL LOW (ref 136–145)

## 2020-05-13 LAB — TROPONIN T GEN 5: Troponin T Gen 5: 11 ng/L (ref <22)

## 2020-05-13 LAB — APTT, BLOOD: PTT: 36 s — ABNORMAL HIGH (ref 25–34)

## 2020-05-13 LAB — INFLUENZA A/B & SARS-COV-2 PCR COMBO FOR RAPID RESPONSE LAB
Influenza A PCR, RRL: NOT DETECTED
Influenza B PCR, RRL: NOT DETECTED
SARS-CoV-2 PCR, RRL: NOT DETECTED

## 2020-05-13 LAB — GLUCOSE (POCT): Glucose (POCT): 124 mg/dL — ABNORMAL HIGH (ref 70–99)

## 2020-05-13 MED ORDER — IOHEXOL 350 MG/ML IV SOLN
75.0000 mL | Freq: Once | INTRAVENOUS | Status: AC
Start: 2020-05-13 — End: 2020-05-13
  Administered 2020-05-13 (×2): 75 mL via INTRAVENOUS
  Filled 2020-05-13: qty 75

## 2020-05-13 NOTE — ED Notes (Signed)
Pt in MRI scanner at this time.

## 2020-05-13 NOTE — ED Notes (Signed)
Stoke code called at this time.

## 2020-05-13 NOTE — ED Procedure Note (Signed)
[facility] Procedure Note  Critical Care  Performed by: Lenda Kelp, DO  Authorized by: Lenda Kelp, DO     Critical care provider statement:     Critical care time (minutes):  40    Critical care was necessary to treat or prevent imminent or life-threatening deterioration of the following conditions:  CNS failure or compromise    Critical care was time spent personally by me on the following activities:  Blood draw for specimens, development of treatment plan with patient or surrogate, discussions with consultants, evaluation of patient's response to treatment, examination of patient, obtaining history from patient or surrogate, review of old charts, ordering and review of radiographic studies, ordering and review of laboratory studies and ordering and performing treatments and interventions    I assumed direction of critical care for this patient from another provider in my specialty: no    Comments:      The patient presented with concerns for CVA. Code stroke activated. CT/CTA ordered, concerning for possible MCA involvement. Neuro recommended hyperacute MRI.       Was ultrasound utilized in this procedure? No

## 2020-05-13 NOTE — Progress Notes (Signed)
Reviewing chart after the patient left the ED, and MRI read demonstrates concerning finding. Neurology was notified of this finding by radiology and they contacted the patient to notify him. Their discussion is documented in a telephone encounter note in this chart. The patient was advised to f/u with his neurologist when he gets back home to [place].

## 2020-05-13 NOTE — ED Notes (Signed)
EKG complete.

## 2020-05-13 NOTE — ED Notes (Addendum)
Pt in MRI at this time 

## 2020-05-13 NOTE — ED Notes (Addendum)
Pt's is back in room 5a and states his speech has improved and states he is back to baseline.

## 2020-05-13 NOTE — Discharge Instructions (Signed)
CT and MRI of your brain showed no stroke or new abnormality.  Please follow-up with your primary care doctor when you get back home to [facility].  Please return to the emergency department if you have any new weakness, difficulty speaking, headache, or any new or concerning symptoms.

## 2020-05-13 NOTE — Consults (Signed)
-----------------------------------------------------------------------------------------------------------------------  [facility] Consult Note     Demographics  Date:   May 14, 2020  Patient Name:  Richard Hernandez  Medical Record #:  [id number]  DOB:    [birth year]  Age:    54 year old  Sex:    male  Location:   05/05A                 DOA:    05/13/2020     Evaluation Requested by:  Lenda Kelp, DO    Evaluator(s):   Patient was evaluated by: Neurovascular (Stroke Service) Team     Teaching Statement:    A Full History (including allergies, medications, past medical & surgical history, social history, family history, 14 system review of systems) and complete physical exam as performed by me, are as transcribed in this and/or the accompanying note. Please see associated note for full details regarding history and physical.  ----------    COAST Advance Directive:  Does patient have a COAST Advanced Directive on file?  No  Was COAST discussed (now or previously) with patient/HCS       Date of Encounter:    05/13/20  Mode of Arrival:    EMS  Referring Provider:       Pre-stroke mRS:  0 - No symptoms    Stroke Timeline (From Stroke Combined DocFlowsheet Entry):   Last Known Well Date:  05/13/20   Last Known Well Time:  0200       Date of Encounter: 05/13/20   Time of Arrival: 0321   Time of Provider Bedside Evaluation:  0321   Time Stroke Code Called:  0314   Time Stroke Provider Callback: 0315   Time Stroke Provider at Bedside   (or On Camera for [facility] Case): 0317   Time of Head CT/ MRI Done: 5409 (CT done at 0342. Hyperacute MRI done 0517.)   Time of Head CT/ MRI Read: 0343   Time IV thrombolytic Treatment Decision  (=Time Verbal Order Given to Pharmacy, Order Placed, or Decision Not to Treat is Made): [id number]       Time IV Thrombolytic arrives at bedside (after mixing):     Time of Stroke Timeout:     Time of IV Thrombolytic Bolus Initiation:     Time of IV Thrombolytic Infusion  Initiation (for alteplase):         Time of Neuro IR Contact   (=Time Stroke Team Contacts Neuro IR):     Time of Neuro IR Decision   (=Time Neuro IR Mobilizes Their Team, or Decision to Exclude by Neuro IR is Made):     In Room Time (Angio Suite):     Groin Puncture Time:     Time of 1st Deployment:     Revascularization Time:     Time Sheath Pulled:       Chief Complaint:  Aphasia, slurred speech    History of Present Illness:      54 year old right-handed male with PMH of possible TIA, migraine with hemiplegia, panic attacks, anxiety, depression, passive SI, and OSA here for acute "code stroke evaluation".    LKW 0200 on 05/13/2020. BP 188/100. BG 106. NIHSS 3 for moderate expressive aphasia with loss of fluency, mild dysarthria, and left facial droop on arrival. Not on antiplatelet or AC at home. He had 1.5 alcoholic beverages around 8 pm when out bar-hopping with his friend.    He reports symptoms started around 2:30 am. He  was out bar-hopping with his friend, when he had sudden onset severe difficulty with getting words out and slurred speech. Denied focal weakness, sensory changes, or vision changes. CTH was negative for acute stroke or hemorrhage. CTA negative for LVO. Speech difficulty progressively improved and his left facial droop resolved after CT imaging, but patient reported still having difficulty with word expression, speed of speech, and mild slurring that persisted. Hyperacute MRI preliminarily revealed no acute ischemia and no obvious evidence of prior ischemic stroke. Patient's symptoms drastically improved after MRI imaging was performed, and he stated he returned to his baseline speech after returning to the ED room. He was thus not a candidate for tpa.     He stated he was told he had a prior TIA in 2017 which presented with similar symptoms of speech difficulty but not to extent of tonight's presentation He later reports history of migraines with 1 episode of prior hemiplegia associated with  his migraine. He reports mild headache and mild nausea at onset of symptoms but denies these are similar to his typical migraines. He was in a bar with strobe lights and loud music so he is unsure if he had photophobia and phonophobia due to the social scene. He also denies auras, and describes typical migraines as headaches with severe nausea, photo/phonophobia requiring him to rest and be in a dark space. He also states he was very stressed tonight when out with his friend due to his friend's intoxicated behavior. He states he has a history of anxiety and many panic attacks, and thinks stress may have triggered his symptoms. He denies prior panic attacks included aphasic symptoms, as it usually causes chest pain, and states he sees a cardiologist annually for this. No prior history of stroke or seizure.     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 He knows he is supposed to take medication (listing metoprolol, aspirin, and duloxetine among medications he should take) but does not.     Review of Systems:    Notable findings from this review were detailed in the HPI.     Review of Symptoms NL ABNL Comments   Constitutional X   No fever    Eyes X   No vision changes   Respiratory X   No cough, SOB    Cardiovascular X   No chest pain, palpitations    Musculoskeletal X   No extremity or joint weakness, pain, or swelling   Neurologic  X  See HPI.    Psychological X  + Anxiety, + depression, no SI   Hematologic X   No blood disorders. No anemia.    GI X   No n/v       Allergies:   No Known Allergies    Not in a hospital admission.     No current facility-administered medications for this encounter.     No current outpatient medications on file.       No past medical history on file.      No past surgical history on file.    Family History:    No family history on file.    Social History     Socioeconomic History    Marital status: Married     Spouse name: Not on  file    Number of children: Not on file    Years of education: Not on file    Highest education level: Not on file  Occupational History    Not on file   Tobacco Use    Smoking status: Never Smoker    Smokeless tobacco: Not on file   Substance and Sexual Activity    Alcohol use: Yes     Comment: 1 drink per month    Drug use: Not Currently    Sexual activity: Not on file   Social Activities of Daily Living Present    Not on file   Social History Narrative    Not on file     Tobacco: none  ETOH: 1 drink per month  Illicit drugs: none  Occupation: Camera operator in [employer]     Physical Exam:   On Exam today, I find:   Vital signs: Recorded in chart, and reviewed by me today.  BMI: Body mass index is 36.59 kg/m.  Vitals:    05/13/20 0444 05/13/20 0459 05/13/20 0600 05/13/20 0700   BP: 148/77 153/94 153/85 167/95   BP Location: Left arm Left arm Right arm Right arm   Pulse: 76 67 82 89   Resp: 16 16 19 18    Temp:       SpO2: 95% 93% 94% 95%   Weight:       Height:         General Exam:  General: Patient is well developed, & in no acute distress.   HEENT: NC/ AT. No conjunctival injection. Intact bilaterally. Moist mucus membranes.  Neck: supple.   CV: RRR, normal S1 & S2, no m.   Pulm: CTAB.  Extremities: warm, no peripheral edema.    Neurological Exam:  Mental Status:  Normal attention span & concentration.   Anxious mood & tearful affect.   Oriented to person, place, and time.    Normal fund of knowledge.   Moderate expressive aphasia with language fluency. Mild dysarthria. On serial repeat exams using cards and conversation, patient improved progressively and rapidly leading to resolution of aphasia and dysarthria symptoms. Repetition intact.   No neglect or inattention.     Cranial Nerves:  II: PERRL, visual fields intact to confrontation.  III, IV, VI: EOMI intact.  V: facial sensation intact bilaterally.  VII: mild left facial droop on arrival which resolved after CT imaging  VIII: hearing  intact to finger rub and conversational voice, no nystagmus.  IX, X: palate elevation symmetric, no hoarseness of voice.  XI: 5/5 strength in trapezius and SCM bilaterally.  XII: tongue protrusion is midline.     Motor:  Normal bulk and tone.  No tremors or dyskinesias.  No pronator drift.  Shoulder Abduction:                                    right: 5/5    left: 5/5  Biceps:                                                          right: 5/5    left: 5/5  Triceps:  right: 5/5    left: 5/5  Wrist Extensors:                                            right: 5/5    left: 5/5  Wrist Flexors:                                                right: 5/5    left: 5/5  Finger Extensors:                                          right: 5/5    left: 5/5  Grip:                                                              right: 5/5    left: 5/5  Hip Flexors:                                                   right: 5/5    left: 5/5  Knee Flexors:                                                right: 5/5    left: 5/5  Knee Extensors:                                           right: 5/5    left: 5/5  Ankle Dorsiflexors:                                       right: 5/5    left: 5/5  Ankle Plantarflexors:                                    right: 5/5    left: 5/5    Coordination:  Intact finger tapping.  Intact finger-nose-finger and heel/shin testing bilaterally.    Sensory:  Light touch: intact in all extremities.  Vibration: intact in all extremities.  Temperature: intact in all extremities.    Reflexes:  Biceps:  right: 2     left: 2  Triceps:  right: 2     left: 2  Brachioradialis: right: 2     left: 2  Patella:  right: 2     left: 2  Achilles:  right: 2     left: 2  Toes:  right: down     left: down    Gait and Stance:  [place]     NIHSS Score (First):  Item Q1a. LOC Code (X/3):  0     Item Q1b. LOCQ (X/2):  0     Item Q1c. LOCC (X/2):  0     Item Q2. Best Gaze (X/2):  0     Item Q3. Visual Fields (X/3):  0    Item Q4. Facial Palsy (X/3):  1  (Slight left facial droop)   Item Q5a. Left Arm Code (X/4): 0    Item Q5b. Right Arm Code (X/4): 0    Item Q6a. Left Leg Code (X/4): 0    Item Q6b. Right Leg Code (X/4): 0    Item Q7. Limb Ataxia (X/2):  0    Item Q8. Sensory Loss (X/2):  0     Item Q9. Aphasia (X/3):  1    Item Q10. Dysarthria (X/2):  1    Item Q11. Extinction/ Neglect (X/2): 0    NIHSS Total Score (Date): 3 (05/13/20)   Time NIHSS Score Performed: 0321     NIHSS Score (Most Recent):  Item Q1a. LOC Code (X/3):  0     Item Q1b. LOCQ (X/2):  0     Item Q1c. LOCC (X/2):  0     Item Q2. Best Gaze (X/2):  0    Item Q3. Visual Fields (X/3):  0    Item Q4. Facial Palsy (X/3):  1  (Slight left facial droop)   Item Q5a. Left Arm Code (X/4): 0    Item Q5b. Right Arm Code (X/4): 0    Item Q6a. Left Leg Code (X/4): 0    Item Q6b. Right Leg Code (X/4): 0    Item Q7. Limb Ataxia (X/2):  0    Item Q8. Sensory Loss (X/2):  0     Item Q9. Aphasia (X/3):  1    Item Q10. Dysarthria (X/2):  1    Item Q11. Extinction/ Neglect (X/2): 0    NIHSS Total Score (Date): 3 (05/13/20)   Time NIHSS Score Performed: 0321     Labs/Imaging/ Studies:       RELEVANT RESULTS/ENCOUNTERS (in visit navigator) were reviewed: Yes  Radiographic film images were personally reviewed by me: Yes  Outside medical records were personally reviewed by me: NA  Regarding outside records, a summary of what was reviewed, and what provider this information came from, is listed below:     CBC  Recent Labs     05/13/20  0326   WBC 7.7   HGB 12.7*   HCT 37.9*   MCV 85.0   RDW 13.1   PLT 243   SEG 57   LYMPHS 31   MONOS 7     Chemistry  Recent Labs     05/13/20  0326   NA 135*   K 3.8   CL 99   BICARB 24   BUN 16   CREAT 0.80   GLU 144*   [place] 8.8        Coags  Recent Labs     05/13/20  0326   PT 12.7*   PTT 36*   INR 1.2     Gucose POC:  Recent Labs     05/13/20  0325   GLUCPOCT 124*     Glucose:  Lab Results   Component Value Date     GLUCPOCT 124 (H) 05/13/2020     No  results found for: CO19, COV19RAASSAY, COVR2019, COVID19PRCLC, COVD19ORSQST, ZCOVR, COV19QUESPCR, SCOVR1, SCOVR2, COV2RNAQUEST, COVID19PANSA   No results found for: SARSCOV2IGG, SARSCOV2IGM, COVIGR   No results found for: SARSCOV2IGG, SARSCOV2IGM, COVIGR   No results found for: CO19, COV19RAASSAY, COVR2019, COVID19PRCLC, COVD19ORSQST, ZCOVR, COV19QUESPCR, SCOVR1, SCOVR2, COV2RNAQUEST, COVID19PANSA    Other Imaging Results:   CT Head, CTA Head & Neck prelim read 05/13/2020:  No acute intracranial hemorrhage, extra-axial fluid collection, midline shift, herniation or hydrocephalus. Gray-white matter differentiation appears intact   Venous contamination partly limits evaluation. No evidence of high-grade proximal stenosis or occlusion of the major arteries of the head neck.   Luminal irregularity of multiple intracranial vessels including the left greater than right M2 branches, likely reflecting noncalcified atherosclerosis without severe stenosis or occlusion. Noncalcified atherosclerotic plaque in the right A1 segment with moderate stenosis.   Calcification in the right M1 branch without occlusion.     Brain MRI prelim read 05/13/2020:  No acute infarct or other acute intracranial findings.     Brain MRI final read 05/13/2020:  FINDINGS:  No evidence of infarction, intracranial mass, or convincing intracranial hemorrhage. No extra-axial collection.    A punctate focus of susceptibility in the left frontal lobe (SWI image 64) could reflect a small thrombus within an M3 branch of the left MCA, particularly given patient's transient expressive aphasia which correlates to the Broca's area in left inferior frontal gyrus. Adjacent focal elevated ASL signal (image 18) can reflect slow or collateral flow.    Ventricles, sulci, and cisterns are unremarkable.    Major intracranial flow voids are preserved.    Atelectatic and opacified right maxillary sinus.  Remaining paranasal sinuses  are clear. Mastoid air cells are essentially clear.  Bones and extracranial soft tissues are unremarkable.    Impression:   54 year old right-handed male with PMH of possible TIA, migraine with hemiplegia, panic attacks, anxiety, depression, passive SI, and OSA here for acute "code stroke evaluation". LKW 0200 on 05/13/2020. NIHSS 3 for moderate expressive aphasia with loss of fluency, mild dysarthria, and left facial droop on arrival, in setting of alcohol consumption during the night. Not on antiplatelet or AC. Symptoms localize to Broca's area at left frontal lobe. CTH prelim read was negative for acute stroke or hemorrhage. CTA prelim read was negative for LVO thus not IR candidate. Hyperacute MRI preliminary read revealed no acute ischemia and no obvious evidence of prior ischemic stroke. MRI final read revealed no acute infarction, but a possible punctate focus in SWI sequence that could reflect a possible small subocclusive thrombus within the left M3 branch correlating with the patient's symptoms. Patient's symptoms drastically improved after MRI imaging and he returned to his baseline on return to ED room. Given all of this, he was not a candidate for tPA as symptoms resolved. Differentials include a TIA given patient's rapid recovery of symptoms within a 24 hour period, history of patient-reported prior TIA of similar symptoms, and MRI findings of a possible punctate focus at region that could contribute to symptoms of transient expressive aphasia. Alternative etiologies were also considered including migraine and psychiatric comorbidities including panic attacks given patient's neurological and psychiatric history. Patient left the ED prior to final read of CT and MRI imaging was performed. He was initially instructed to follow up with his outpatient physicians for the final read. He was also notified of updated final imaging reports and instructed to follow up with his neurologist in [place] for  further management.  Diagnosis: Possible TIA    Treatment Recommendations:  Decision to (or not to) administer IV thrombolytic therapy was made after careful discussion of patient's clinical and imaging findings, including a discussion detailing the absence of intracerebral hemorrhage, with the Stroke Faculty.    IV Thrombolytic Therapy:  IV Thrombolytic Treatment: No  Reasons for IV Thrombolytic Exclusion: Transient Ischemic Attack;Not a Stroke  Time Decision to Mix or Not Mix IV Thrombolytic (=Time Verbal Order Give to Pharmacy, Order Placed, or Decision to Exclude is Made: 0517    Endovascular Procedure:  Neuro IR Procedure?: No  Reason not NIR Candidate:: No LVO    <-Plan->:    # Possible TIA  -Patient was previously instructed to follow up with his outpatient physicians for the final read of CT and MRI imaging.   -Patient notified via telephone of updated final imaging reports and instructed to follow up with his neurologist in [place] for further management.    -Follow up with outpatient Neurologist for further management   -Remainder dispo per ED.    #Stroke Clinical Trial Eligibility:   TIMELESS:  NA  MOST:   NA  CHARM:  NA  SLEEPSMART: NA  ARCADIA:   NA  SATURN:  NA  FASTEST:  NA  DISCOVERY:  NA  COAST:  NA    #TJC- Stroke Measures (Running ScoreCard):  Swallow Study Result: (Verify with Nursing)     VTE Prophylaxis by end of HD2:      Carotid Imaging:     Antithrombotic Medication by end of HD2:      Atrial Fibrillation Medication at D/C:      Cholesterol Medication at D/C:     Antithrombotic Medication at D/C:     Type of Stroke Question Completed   (Found in Understanding Risk Factors Section):     Rehabilitation Assessment:     Stroke Risk Factor Education Daily:   (Verify with Nursing)     Smoking Cessation Education at D/C:   (Verify with Nursing)         Discussed with Stroke Fellow, Dr. Corrie Mckusick, who was in discussion with Faculty Dr. Jaclynn Guarneri.     Note Author: Claudia Desanctis,  MD  Neurology Resident PGY2

## 2020-05-13 NOTE — ED Provider Notes (Signed)
Emergency Department Note  [facility] electronic medical record reviewed for pertinent medical history.     Nursing Triage Note:   Chief Complaint   Patient presents with    Stroke     BIBA for a stroke code with slow slurred speech, trouble finding words, right sided facial droop. LNW 0200 hx TIA        HPI:   54 year old male with a PMH significant for panic disorder, depression, anxiety, complex migraines, possible prior TIA presenting as a "stroke code" with for difficulty speaking and L sided facial droop, LKW 0200 on 05/13/2020. BP 188/100. BG 106. NIHSS 3 for moderate expressive aphasia with loss of fluency, mild dysarthria, and left facial droop on arrival. No blood thinners. Patient endorses was out at the bar with his friends, only had 1.5 beers around 8 PM. Symptoms started around 2:30 AM when he was out with his friend and noticed that he had sudden onset of difficulty with speaking and abnormal sounding speech.  Denies headache, weakness, vision changes, shortness of breath, chest pain, abdominal pain, back pain, neck pain.    Family History:  For past Medical/Surgical/Social/Family History, refer to HPI, unless explicitly noted here.    Social History [facility]):  Lives in [place], visiting    Alcohol History [facility]):  Occasional limited drinking, drank     Medications:   None       Allergies: Patient has no known allergies.    Review of Systems:   All others negative  Review of Systems  All other systems reviewed and negative unless otherwise noted in the HPI or above. This was done per my custom and practice for systems appropriate to the chief complaint in an emergency department setting and varies depending on the quality of history that the patient is able to provide.      Physical Exam:   05/13/20  0444 05/13/20  0459 05/13/20  0600 05/13/20  0700   BP: 148/77 153/94 153/85 167/95   Pulse: 76 67 82 89   Resp: 16 16 19 18    Temp:       SpO2: 95% 93% 94% 95%     Nursing note and vitals reviewed.      Physical Exam  Vitals and nursing note reviewed.   Constitutional:       General: He is not in acute distress.     Appearance: He is not ill-appearing.   HENT:      Head: Normocephalic and atraumatic.      Nose: Nose normal.      Mouth/Throat:      Mouth: Mucous membranes are moist.      Pharynx: Oropharynx is clear.   Eyes:      Extraocular Movements: Extraocular movements intact.      Conjunctiva/sclera: Conjunctivae normal.      Pupils: Pupils are equal, round, and reactive to light.   Cardiovascular:      Rate and Rhythm: Normal rate and regular rhythm.      Pulses: Normal pulses.      Heart sounds: Normal heart sounds. No murmur heard.       Comments: 2+ radial pulses b/l  Pulmonary:      Effort: Pulmonary effort is normal. No respiratory distress.      Breath sounds: Normal breath sounds. No stridor. No wheezing, rhonchi or rales.   Abdominal:      General: Abdomen is flat. There is no distension.      Palpations: Abdomen  is soft. There is no mass.      Tenderness: There is no abdominal tenderness. There is no guarding or rebound.   Musculoskeletal:         General: No swelling, tenderness, deformity or signs of injury. Normal range of motion.      Cervical back: Normal range of motion and neck supple.   Skin:     General: Skin is warm and dry.      Capillary Refill: Capillary refill takes less than 2 seconds.      Coloration: Skin is not jaundiced or pale.      Findings: No bruising, erythema or rash.   Neurological:      Mental Status: He is alert and oriented to person, place, and time.      Sensory: No sensory deficit.      Motor: No weakness or pronator drift.      Coordination: Finger-Nose-Finger Test normal.      Comments: On arrival, L sided facial droop noted, dysarthric w/ marked expressive aphasia, able to slowly answer orientation questions appropriately       Impression & ED Plan:  54 year old male with a PMH significant for panic disorder, depression, anxiety, complex migraines, possible  prior TIA presenting as a "stroke code" with for difficulty speaking and L sided facial droop, LKW 0200 on 05/13/2020. BP 188/100. BG 106. NIHSS 3 for moderate expressive aphasia with loss of fluency, mild dysarthria, and left facial droop on arrival.  Patient immediately taken to CT scanner for CTA stroke head and neck with neuro stroke team. CTH was negative for acute stroke or hemorrhage. CTA negative for LVO.  Given continued concern for possible acute ischemia, hyper acute MRI was obtained. Hyperacute MRI preliminarily revealed no acute ischemia and no obvious evidence of prior ischemic stroke per neuro stroke. Patient's symptoms drastically improved after MRI imaging, and he stated he returned to his baseline speech and mental status.  No longer having left-sided facial droop or dysarthria or expressive aphasia on re-evaluation once patient return to emergency department room.  Patient currently states that he feels well and back to normal.  Patient not completely sure what caused his symptoms but states that he has history of panic disorder and thinks it was potentially a panic attack or complex migraine. States he did feel very stressed tonight by his friend's intoxication.  Patient states when he had his prior possible TIA he was told it was potentially a TIA versus complex migraine.  Stroke code was canceled.  Per neuro stroke, TIA was thought to be possible but unlikely and other possible etiologies including panic attack versus complex migraine.  Patient observed in the emergency department for 4-1/2 hours and remained at baseline and feeling well.  Denied any other symptoms.  Tolerating p.o. ambulating without difficulty.  No significant laboratory abnormalities, no electrolyte abnormalities, troponin normal and no evidence of acute ischemia on EKG.  Low concern for toxic or metabolic etiology of symptoms.    Vitals remained within normal limits and patient frequently reassessed and continues to have no  focal neuro deficits, mentate and speak at baseline.  Patient requesting to leave at this time and has been cleared by neuro stroke team.  Patient states that he has a flight back to [place] today.  Patient safe for discharge with close follow-up with primary care doctor and his neurologist in [place] that he has for essential tremor.    Workup Review:  ED Course as of  05/14/20 1900   Althea Grimmer Belinda's Documentation   Fri May 13, 2020   0629 Troponin T Gen 5: [phone] Pending troponin and neurology final recommendations.   3154 Denies chest pain, shortness of breath, nausea, vomiting, abdominal pain, numbness or tingling, weakness.  Patient states that he feels well at this time.  States that he only had 1 and half drinks tonight and denies other drug use.   0086 No stroke on MRI per neuro stroke team.  Stroke code canceled.  Patient back to his baseline at this time.  Patient states that he has history of panic disorder and anxiety attacks and also migraines with aura and thinks that he was likely having a panic attack that caused his symptoms and dysarthria.   0327 Glucose (POCT)(!): 124   0325 Last known normal 2:00 a.m.Marland Kitchen  Patient was out drinking with friends when they noticed that he started having abnormal speech and abnormal behavior.  Left-sided facial droop and dysarthria noted by medics.  5/5 bilateral upper and lower extremity strength      Others' Documentation   Fri May 13, 2020   0705 DC by primary team  [TC]   0630 S/o AB     89M hx of panic disorder, migraines with disarthria, left sided facial droop.   Stroke workup, no evidence of stroke on CT or MRI   Back to baseline now   He states thinks he had a panic attack or complex migraine     Plan:   - neuro final recs [TC]      ED Course User Index  [TC] Sadie Haber, MD       ED EKG Documentation:  NSR, no evidence of acute ischemia    ED Orders:  Orders Placed This Encounter   Procedures    Critical Care Procedure    CT STROKE  Angiography Head and Neck    MRI Acute Stroke Code (Neurology Only)    Prothrombin Time, Blood Blue    Basic Metabolic Panel, Blood Green Plasma Separator Tube    CBC w/ Diff Lavender    aPTT, Blood Blue    GLUCOSE (POCT)    Lab Add On Test    Troponin T Gen 5    ECG 12 Lead     Labs Reviewed   PROTHROMBIN TIME, BLOOD - Abnormal; Notable for the following components:       Result Value    PT,Patient 12.7 (*)     All other components within normal limits   BASIC METABOLIC PANEL, BLOOD - Abnormal; Notable for the following components:    Glucose 144 (*)     Sodium 135 (*)     All other components within normal limits   CBC WITH DIFF, BLOOD - Abnormal; Notable for the following components:    RBC 4.46 (*)     Hgb 12.7 (*)     Hct 37.9 (*)     MPV 8.6 (*)     All other components within normal limits   APTT, BLOOD - Abnormal; Notable for the following components:    PTT 36 (*)     All other components within normal limits   GLUCOSE (POCT) - Abnormal; Notable for the following components:    Glucose (POCT) 124 (*)     All other components within normal limits   LAB ADD ON TEST   TROPONIN T GEN 5   INFLUENZA A/B & SARS-COV-2 PCR COMBO FOR RAPID RESPONSE LAB  Narrative:     Collect using a flocked swab in the designated M4RT viral  transport medium. Other swab types or VTM tubes will be  rejected.  Is this COVID-19 test being ordered as screening prior to  surgery or another elective procedure?->Yes     I have discussed my evaluation and care plan for the patient with the attending physician Dr. Shirlyn GoltzMueller.    Julious PayerAnna B. Marilynne DriversBaillie, MD.  Emergency Medicine PGY-2         Katrine CohoBaillie, Shakita Keir Belinda, MD  Resident  05/14/20 Prentice [id number]       Katrine CohoBaillie, Lillyanna Glandon Belinda, MD  Resident  05/14/20 1918       Lenda KelpMueller, Matthew Dean, DO  05/15/20 1344

## 2020-05-13 NOTE — Telephone Encounter (Signed)
I called Richard Hernandez this morning to tell him about a possible abnormality neuroradiology had called earlier to discuss. I also called to see how he was doing.    He is doing well, back to his baseline per him. He endorsed having issues with his communication overnight but no longer having language difficulties now. 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 He thinks he may not be taking care of himself properly, but is aware and is making the effort to seek a new PCP. He has an appointment with his cardiologist [facility]) on the 25th of this month and was planning to see his Neurologist too (sees him for essential tremor). He was planning on getting his medical records sent to them so he can follow up when he gets back home.    He was in good spirits and was agreeable to close follow up with his neurologist to discuss his imaging findings on MRI (specifically in the susceptibility imaging SWAN) that had seen a possible sub-occlusive thrombus in the distal left M3 with decreased flow in a nearby area on the ASL sequence.    I recommended calling medical records at [phone] to request his medical records from his ED visit last night. I also provided stroke education by telling him to be aware of signs and symptoms (weakness, difficulty communicating, sensory changes, etc) and to call 911 or present to ED immediately to be evaluated.    Call started at 11:15am  Call ended at 11:30am    M. Doreene Burke, MD  Stroke Fellow

## 2020-05-13 NOTE — ED Notes (Addendum)
9622 Stroke code called  0321 Pt arrived, Neuro MD Sol Blazing, Pharmacist VU, MD Mueller at the bedside. Report received from EMS with c/o of slow speech, trouble finding words, right sided facial droop, was out with friends and had two alcoholic drinks hx TIA. Denies  blood thinners.   0324 BS 124  0325 Pt taken to CT   0329 Head ct in progress  0331 CT head complete  0334 CTA in progress  0337 CTA complete  0350 pt arrived in room 5a

## 2020-05-13 NOTE — ED MD Progress Note (Signed)
ED Course as of 05/16/20 1050   [place], [place] Documentation   Fri May 13, 2020   0705 DC by primary team    0630 S/o AB     62M hx of panic disorder, migraines with disarthria, left sided facial droop.   Stroke workup, no evidence of stroke on CT or MRI   Back to baseline now   He states thinks he had a panic attack or complex migraine     Plan:   - neuro final recs      Others' Documentation   Fri May 13, 2020   0629 Troponin T Gen 5: 11 [AB]   0605 Pending troponin and neurology final recommendations. [AB]   [id number] Denies chest pain, shortness of breath, nausea, vomiting, abdominal pain, numbness or tingling, weakness.  Patient states that he feels well at this time.  States that he only had 1 and half drinks tonight and denies other drug use. [AB]   0537 No stroke on MRI per neuro stroke team.  Stroke code canceled.  Patient back to his baseline at this time.  Patient states that he has history of panic disorder and anxiety attacks and also migraines with aura and thinks that he was likely having a panic attack that caused his symptoms and dysarthria. [AB]   0327 Glucose (POCT)(!): 124 [AB]   0325 Last known normal 2:00 a.m.Marland Kitchen  Patient was out drinking with friends when they noticed that he started having abnormal speech and abnormal behavior.  Left-sided facial droop and dysarthria noted by medics.  5/5 bilateral upper and lower extremity strength [AB]      ED Course User Index  [AB] Katrine Coho, MD     Sadie Haber, MD  Emergency Medicine PGY-4

## 2020-05-13 NOTE — ED Provider Notes (Signed)
Presented as stroke code. Last known normal 2am. Evaluated upon arrival, is hypertensive.  Point of care glucose 106. Has expressive aphasia and mild dysarthria and left-sided facial droop.  No anticoagulation use at home.  neuro at bedside. To CT.  Upon return from CT complete neuro exam was performed and I Stroke Scale 3. CT negative for acute bleed.  CTA negative for all p.o., however, there was finding that was concerning for possible MCA involvement and neurology recommended hyperacute MRI.  No tPA advised at this time.  Hyperacute MRI resulted without evidence of ischemia.  Patient returned back to baseline.  Discussed with neurology who advised likely complex migraine as etiology of neurologic findings.       Lenda Kelp, DO  05/13/20 2134

## 2020-05-13 NOTE — ED Notes (Signed)
Bed: 05A  Expected date:   Expected time:   Means of arrival:   Comments:  Stroke code

## 2020-05-13 NOTE — EMS Narrative (Addendum)
Alert:  [phone]  [id number]  0001  M1  ALS-Paramedic  [address]  [id number]  [phone]    CC:  2021-10-14T08:00:05.000-07:00  General/Global    Impression:  Primary Symptom: R46.2 - Strange and inexplicable behavior  Other Associated Symptoms: R53.1 - Weakness  Provider's Primary Impression: I63.9 - Cerebral infarction, unspecified  Provider's Secondary Impressions: R41.82 - Altered mental status,   unspecified  Emergent (Yellow)    HPI:Pt Age: 54 Years; Gender: Male;Primary Impression:   Stroke/CVA/TIA;Modena Slater [id number] S/S: weakness;     Medical History: not recorded    Allergies to Medications: not recorded    Environmental and Food Allergies:    Current Medications: not recorded    Assessment:    Cardiac Arrest:  No    EMS Airway:    EMS Medications:    EMS Procedures:  Date/Time Procedure Performed: 2021-10-15T03:08:13.000-07:00  Procedure Performed Prior to this Unit's EMS Care: Yes  Procedure: [id number]  Procedure Complication: None  Date/Time Procedure Performed: 2021-10-15T03:08:13.000-07:00  Procedure Performed Prior to this Unit's EMS Care: Yes  Procedure: [id number]  Procedure Complication: None  Date/Time Procedure Performed: 2021-10-15T03:08:13.000-07:00  Procedure: [id number]  Procedure Complication: None    EMS Exam:    Patient Demographics:  Last Name: Madjd-sadjadi  First Name: [facility] Address: [address] [place]  Patient's [place]: [id number]  Patient's Home [place]: [phone]  Patient's Home State: 06  Patient's Home ZIP Code: [id number]  Patient's Country of Residence: [place]  Gender: Male  Race: White  Age: 36  Age Units: Years    Demographics Practitioner:    Advanced Directives:    Demographics Payment:    Incident Times:  Unit Notified by Dispatch Date/Time: 2021-10-15T02:47:32.000-07:00  Unit En Route Date/Time: 2021-10-15T02:47:36.000-07:00  Unit Arrived on Scene Date/Time: 2021-10-15T02:54:50.000-07:00  Arrived at Patient Date/Time: 2021-10-15T02:57:18.000-07:00  Unit Left Scene Date/Time:  2021-10-15T03:09:41.000-07:00

## 2020-05-13 NOTE — ED Notes (Signed)
MRI called and are ready for patient. Pt taken to MRI at this time

## 2020-05-14 ENCOUNTER — Telehealth (HOSPITAL_BASED_OUTPATIENT_CLINIC_OR_DEPARTMENT_OTHER): Payer: Self-pay | Admitting: Student in an Organized Health Care Education/Training Program

## 2020-05-14 NOTE — Telephone Encounter (Signed)
In addition to Dr. Bryn Gulling (Stroke Fellow) telephone encounter on 05/13/2020, I also called Richard Hernandez this evening to check in on him and discuss the final read of the MRI scan.     He was informed about a possible abnormality on his MRI (SWI sequence) that could possibly reflect a punctate subocclusive thrombus in the distal left M3 which could correlate with his transient presenting symptoms. He was instructed again to follow up with his neurologist (who he follows for essential tremor) and to follow up closely with his neurologist to discuss the MRI for further care.     He was instructed to get his medical records sent to his neurologist and providers in back home so that they may be updated on his ED visit and imaging findings. He was previously given the number for medical records by Dr. Doreene Burke.     Currently, he reports he is with his father in [place]. He reports being at baseline. He has not returned yet to [place] but will plan to soon.     Richard Desanctis, MD  PGY2 Neurology Resident

## 2020-05-15 NOTE — ED EKG Interpretation (Signed)
ED EKG Interpretation    EKG: Normal Sinus Rhythm with Normal Axis and nonspecific ST and T wave changes likely artifact. Rate 81, PR 112, QRS 94, QTc 434. No prior ECG for comparison

## 2021-07-01 ENCOUNTER — Other Ambulatory Visit: Payer: Self-pay

## 2021-07-01 ENCOUNTER — Encounter (HOSPITAL_COMMUNITY): Payer: Self-pay | Admitting: *Deleted

## 2021-07-01 ENCOUNTER — Emergency Department (HOSPITAL_COMMUNITY): Payer: BC Managed Care – PPO

## 2021-07-01 ENCOUNTER — Emergency Department (HOSPITAL_COMMUNITY)
Admission: EM | Admit: 2021-07-01 | Discharge: 2021-07-02 | Disposition: A | Discharge status: Home / Self Care | Payer: BC Managed Care – PPO | Attending: Emergency Medicine | Admitting: Emergency Medicine

## 2021-07-01 DIAGNOSIS — R079 Chest pain, unspecified: Secondary | ICD-10-CM | POA: Diagnosis present

## 2021-07-01 DIAGNOSIS — R42 Dizziness and giddiness: Secondary | ICD-10-CM | POA: Diagnosis not present

## 2021-07-01 DIAGNOSIS — Z5321 Procedure and treatment not carried out due to patient leaving prior to being seen by health care provider: Secondary | ICD-10-CM | POA: Insufficient documentation

## 2021-07-01 LAB — COMPREHENSIVE METABOLIC PANEL
ALT: 26 U/L (ref 0–44)
AST: 20 U/L (ref 15–41)
Albumin: 4.1 g/dL (ref 3.5–5.0)
Alkaline Phosphatase: 64 U/L (ref 38–126)
Anion gap: 9 (ref 5–15)
BUN: 13 mg/dL (ref 6–20)
CO2: 27 mmol/L (ref 22–32)
Calcium: 9.5 mg/dL (ref 8.9–10.3)
Chloride: 100 mmol/L (ref 98–111)
Creatinine, Ser: 0.78 mg/dL (ref 0.61–1.24)
GFR, Estimated: >60 mL/min (ref >60)
Glucose, Bld: 154 mg/dL — ABNORMAL HIGH (ref 70–99)
Potassium: 3.7 mmol/L (ref 3.5–5.1)
Sodium: 136 mmol/L (ref 135–145)
Total Bilirubin: 0.7 mg/dL (ref 0.3–1.2)
Total Protein: 7.2 g/dL (ref 6.5–8.1)

## 2021-07-01 LAB — CBC
HCT: 43.3 % (ref 39.0–52.0)
Hemoglobin: 14.5 g/dL (ref 13.0–17.0)
MCH: 28.7 pg (ref 26.0–34.0)
MCHC: 33.5 g/dL (ref 30.0–36.0)
MCV: 85.6 fL (ref 80.0–100.0)
Platelets: 232 10*3/uL (ref 150–400)
RBC: 5.06 MIL/uL (ref 4.22–5.81)
RDW: 13.2 % (ref 11.5–15.5)
WBC: 9.5 10*3/uL (ref 4.0–10.5)
nRBC: 0 % (ref 0.0–0.2)

## 2021-07-01 LAB — TROPONIN I (HIGH SENSITIVITY)
Troponin I (High Sensitivity): 26 ng/L — ABNORMAL HIGH (ref <18)
Troponin I (High Sensitivity): 22 ng/L — ABNORMAL HIGH (ref <18)

## 2021-07-01 LAB — LIPASE, BLOOD: Lipase: 28 U/L (ref 11–51)

## 2021-07-01 NOTE — ED Triage Notes (Signed)
The pt arrived by [facility]  the pt was sent here for treatment  for chest pain since this am.  No dizziness initially the middle of the day he had dizziness with the chest pain  he was given aspirin 324mg  and I sl nitro at [facility]  no chest pain at present

## 2021-07-01 NOTE — ED Notes (Signed)
Patient left on own accord °

## 2021-07-01 NOTE — ED Provider Notes (Signed)
Emergency Medicine Provider Triage Evaluation Note  Nathaniel Hall , a 55 y.o. male  was evaluated in triage.  Pt complains of pain that started this morning around 8 AM.  Patient states that it was present for about 9 hours.  It was in mid chest region and did not radiate.  No diaphoresis, shortness of breath during the episode.  He did have associated dizziness.  He was seen in urgent care and found to be hypertensive with a systolic over 200.  They sent him over to the emergency department for further evaluation.  He was given nitroglycerin at that time which did relieve his chest pain.  He also was given 4 baby aspirin.  Patient reports resolution of his symptoms on my evaluation.  Review of Systems  Positive: Above Negative:   Physical Exam  BP (!) 147/77 (BP Location: Left Arm)   Pulse 94   Temp 98.6 F (37 C)   Resp 18   Ht 5\' 11"  (1.803 m)   Wt 110.1 kg   SpO2 97%   BMI 33.85 kg/m  Gen:   Awake, no distress   Resp:  Normal effort  MSK:   Moves extremities without difficulty  Other:    Medical Decision Making  Medically screening exam initiated at 6:09 PM.  Appropriate orders placed.  Mantaj Madjd-Sadjadi was informed that the remainder of the evaluation will be completed by another provider, this initial triage assessment does not replace that evaluation, and the importance of remaining in the ED until their evaluation is complete.  Blood pressure reasonable level here in the ED.   07/01/21 1811    14/03/22, MD 07/01/21 1850

## 2021-07-26 ENCOUNTER — Other Ambulatory Visit: Payer: Self-pay | Admitting: Urology

## 2021-08-10 ENCOUNTER — Encounter (HOSPITAL_BASED_OUTPATIENT_CLINIC_OR_DEPARTMENT_OTHER): Payer: Self-pay | Admitting: Urology

## 2021-08-10 NOTE — Progress Notes (Signed)
Patient to arrive at 1030 on 08/14/2021. History and medications reviewed. Pre-procedure instructions given. NPO after MN Sunday except for clear liquids until 0830. Instructed to take BP meds but hold Metformin in am. Patient to bring CPAP. Driver secured.

## 2021-08-14 ENCOUNTER — Other Ambulatory Visit: Payer: Self-pay

## 2021-08-14 ENCOUNTER — Ambulatory Visit (HOSPITAL_BASED_OUTPATIENT_CLINIC_OR_DEPARTMENT_OTHER)
Admission: RE | Admit: 2021-08-14 | Discharge: 2021-08-14 | Disposition: A | Discharge status: Home / Self Care | Payer: BC Managed Care – PPO | Attending: Urology | Admitting: Urology

## 2021-08-14 ENCOUNTER — Encounter (HOSPITAL_BASED_OUTPATIENT_CLINIC_OR_DEPARTMENT_OTHER)
Admission: RE | Disposition: A | Discharge status: Home / Self Care | Payer: Self-pay | Source: Home / Self Care | Attending: Urology

## 2021-08-14 ENCOUNTER — Encounter (HOSPITAL_BASED_OUTPATIENT_CLINIC_OR_DEPARTMENT_OTHER): Payer: Self-pay | Admitting: Urology

## 2021-08-14 ENCOUNTER — Ambulatory Visit (HOSPITAL_COMMUNITY): Payer: BC Managed Care – PPO

## 2021-08-14 DIAGNOSIS — E669 Obesity, unspecified: Secondary | ICD-10-CM | POA: Insufficient documentation

## 2021-08-14 DIAGNOSIS — K759 Inflammatory liver disease, unspecified: Secondary | ICD-10-CM | POA: Diagnosis not present

## 2021-08-14 DIAGNOSIS — N200 Calculus of kidney: Secondary | ICD-10-CM | POA: Diagnosis not present

## 2021-08-14 DIAGNOSIS — G4730 Sleep apnea, unspecified: Secondary | ICD-10-CM | POA: Diagnosis not present

## 2021-08-14 DIAGNOSIS — I10 Essential (primary) hypertension: Secondary | ICD-10-CM | POA: Diagnosis not present

## 2021-08-14 DIAGNOSIS — E119 Type 2 diabetes mellitus without complications: Secondary | ICD-10-CM | POA: Diagnosis not present

## 2021-08-14 HISTORY — DX: Headache, unspecified: R51.9

## 2021-08-14 HISTORY — DX: Type 2 diabetes mellitus without complications: E11.9

## 2021-08-14 HISTORY — DX: Transient cerebral ischemic attack, unspecified: G45.9

## 2021-08-14 HISTORY — DX: Essential (primary) hypertension: I10

## 2021-08-14 HISTORY — DX: Sleep apnea, unspecified: G47.30

## 2021-08-14 HISTORY — PX: EXTRACORPOREAL SHOCK WAVE LITHOTRIPSY: SHX1557

## 2021-08-14 LAB — GLUCOSE, CAPILLARY: Glucose-Capillary: 110 mg/dL — ABNORMAL HIGH (ref 70–99)

## 2021-08-14 SURGERY — LITHOTRIPSY, ESWL
Anesthesia: LOCAL | Laterality: Left

## 2021-08-14 MED ORDER — CIPROFLOXACIN HCL 500 MG PO TABS
500.0000 mg | ORAL_TABLET | ORAL | Status: AC
  Administered 2021-08-14: 500 mg via ORAL

## 2021-08-14 MED ORDER — HYDROCODONE-ACETAMINOPHEN 5-325 MG PO TABS: 1.0000 | ORAL_TABLET | Freq: Four times a day (QID) | ORAL | 0 refills | Status: DC | PRN

## 2021-08-14 MED ORDER — DIPHENHYDRAMINE HCL 25 MG PO CAPS
ORAL_CAPSULE | ORAL | Status: AC
  Filled 2021-08-14: qty 1

## 2021-08-14 MED ORDER — DIAZEPAM 5 MG PO TABS
10.0000 mg | ORAL_TABLET | ORAL | Status: AC
  Administered 2021-08-14: 10 mg via ORAL

## 2021-08-14 MED ORDER — CIPROFLOXACIN HCL 500 MG PO TABS
ORAL_TABLET | ORAL | Status: AC
  Filled 2021-08-14: qty 1

## 2021-08-14 MED ORDER — DIPHENHYDRAMINE HCL 25 MG PO CAPS
25.0000 mg | ORAL_CAPSULE | ORAL | Status: AC
  Administered 2021-08-14: 25 mg via ORAL

## 2021-08-14 MED ORDER — DIAZEPAM 5 MG PO TABS
ORAL_TABLET | ORAL | Status: AC
  Filled 2021-08-14: qty 2

## 2021-08-14 MED ORDER — SODIUM CHLORIDE 0.9 % IV SOLN: INTRAVENOUS | Status: DC

## 2021-08-14 NOTE — Discharge Instructions (Addendum)
1 - You may have urinary urgency (bladder spasms), pass small stone fragments, and bloody urine on / off for up to 2 weeks. This is normal.  2 - Call MD or go to ER for fever >102, severe pain / nausea / vomiting not relieved by medications, or acute change in medical status     Post Anesthesia Home Care Instructions  Activity: Get plenty of rest for the remainder of the day. A responsible individual must stay with you for 24 hours following the procedure.  For the next 24 hours, DO NOT: -Drive a car -Operate machinery -Drink alcoholic beverages -Take any medication unless instructed by your physician -Make any legal decisions or sign important papers.  Meals: Start with liquid foods such as gelatin or soup. Progress to regular foods as tolerated. Avoid greasy, spicy, heavy foods. If nausea and/or vomiting occur, drink only clear liquids until the nausea and/or vomiting subsides. Call your physician if vomiting continues.  Special Instructions/Symptoms: Your throat may feel dry or sore from the anesthesia or the breathing tube placed in your throat during surgery. If this causes discomfort, gargle with warm salt water. The discomfort should disappear within 24 hours.     

## 2021-08-14 NOTE — Progress Notes (Addendum)
Patient entered recovery at 1318 with petechiae rash on left lower side from lithotripsy.

## 2021-08-14 NOTE — H&P (Incomplete)
Nathaniel Hall is an 56 y.o. male.    Chief Complaint: Pre-Op LEFT Shockwave Lithotripsy  HPI:   1 - LEFT UPJ Stone - 12mm left UPJ stone by KUB and CT late 2022 on stone surveilance.  Today "Nathaniel Hall" is seen to proceed with LEFT shockwave lithotripsy. UA without significant infectious parameters.   Past Medical History:  Diagnosis Date   Depression    Diabetes mellitus without complication (HCC)    Headache    Hypercholesteremia    Hypertension    Sleep apnea    TIA (transient ischemic attack)     Past Surgical History:  Procedure Laterality Date   EXTRACORPOREAL SHOCK WAVE LITHOTRIPSY Right 03/07/2020   Procedure: EXTRACORPOREAL SHOCK WAVE LITHOTRIPSY (ESWL);  Surgeon: Jerilee Field, MD;  Location: [facility];  Service: Urology;  Laterality: Right;    No family history on file. Social History:  reports that he has never smoked. He has never used smokeless tobacco. He reports that he does not drink alcohol and does not use drugs.  Allergies:  Allergies  Allergen Reactions   Percocet [Oxycodone-Acetaminophen]     Medications Prior to Admission  Medication Sig Dispense Refill   metFORMIN (GLUCOPHAGE) 500 MG tablet Take 500 mg by mouth 2 (two) times daily with a meal.     amLODipine (NORVASC) 5 MG tablet Take 5 mg by mouth daily.     aspirin 81 MG chewable tablet Chew 81 mg daily by mouth.     atorvastatin (LIPITOR) 10 MG tablet Take 10 mg daily by mouth.     citalopram (CELEXA) 40 MG tablet Take 40 mg daily by mouth.     DULoxetine (CYMBALTA) 60 MG capsule Take 90 mg by mouth daily.     eletriptan (RELPAX) 40 MG tablet Take 40 mg by mouth as needed for migraine or headache. May repeat in 2 hours if headache persists or recurs.     HYDROcodone-acetaminophen (NORCO/VICODIN) 5-325 MG tablet Take 1 tablet every 6 (six) hours as needed by mouth. 11 tablet 0   indapamide (LOZOL) 1.25 MG tablet Take 1.25 mg by mouth daily.     metoprolol succinate  (TOPROL-XL) 25 MG 24 hr tablet Take 25 mg by mouth daily.     ondansetron (ZOFRAN) 4 MG tablet Take 4 mg by mouth every 8 (eight) hours as needed for nausea or vomiting.     tamsulosin (FLOMAX) 0.4 MG CAPS capsule Take 1 capsule (0.4 mg total) daily by mouth. 30 capsule 0    No results found for this or any previous visit (from the past 48 hour(s)). No results found.  Review of Systems  Constitutional: Negative.  Negative for chills and fever.  HENT: Negative.    Eyes: Negative.   Respiratory: Negative.    Cardiovascular: Negative.   Gastrointestinal: Negative.   Endocrine: Negative.   Genitourinary: Negative.   Musculoskeletal: Negative.   Skin: Negative.   Allergic/Immunologic: Negative.   Neurological: Negative.   Hematological: Negative.   Psychiatric/Behavioral: Negative.    All other systems reviewed and are negative.  There were no vitals taken for this visit. Physical Exam Vitals reviewed.  HENT:     Head: Normocephalic.     Mouth/Throat:     Mouth: Mucous membranes are moist.  Eyes:     Pupils: Pupils are equal, round, and reactive to light.  Cardiovascular:     Rate and Rhythm: Normal rate.  Pulmonary:     Effort: Pulmonary effort is normal.  Abdominal:  General: Abdomen is flat.  Musculoskeletal:        General: Normal range of motion.     Cervical back: Normal range of motion.  Skin:    General: Skin is warm.  Neurological:     General: No focal deficit present.     Mental Status: He is alert.  Psychiatric:        Mood and Affect: Mood normal.     Assessment/Plan  Proceed as planned with LEFT shockwave lithotripsy. Risks, benefits, alternatives, expected peri-op course discussed previously and reiterated today.   Sebastian Ache, MD 08/14/2021, 11:11 AM

## 2021-08-14 NOTE — Brief Op Note (Signed)
08/14/2021  1:02 PM  PATIENT:  Nathaniel Hall  56 y.o. male  PRE-OPERATIVE DIAGNOSIS:  LEFT RENAL CALCULUS  POST-OPERATIVE DIAGNOSIS:  * No post-op diagnosis entered *  PROCEDURE:  Procedure(s) with comments: EXTRACORPOREAL SHOCK WAVE LITHOTRIPSY (ESWL) (Left) - ONLY NEEDS 75 MIN  SURGEON:  Surgeon(s) and Role:    * Alexis Frock, MD - Primary  PHYSICIAN ASSISTANT:   ASSISTANTS: none   ANESTHESIA:   MAC  EBL:  minimal   BLOOD ADMINISTERED:none  DRAINS: none   LOCAL MEDICATIONS USED:  NONE  SPECIMEN:  No Specimen  DISPOSITION OF SPECIMEN:  N/A  COUNTS:  YES  TOURNIQUET:  * No tourniquets in log *  DICTATION: .Note written in paper chart  PLAN OF CARE: Discharge to home after PACU  PATIENT DISPOSITION:  PACU - hemodynamically stable.   Delay start of Pharmacological VTE agent (>24hrs) due to surgical blood loss or risk of bleeding: not applicable

## 2021-08-16 ENCOUNTER — Encounter (HOSPITAL_BASED_OUTPATIENT_CLINIC_OR_DEPARTMENT_OTHER): Payer: Self-pay | Admitting: Urology

## 2022-08-30 ENCOUNTER — Other Ambulatory Visit (HOSPITAL_COMMUNITY): Payer: Self-pay | Admitting: Family Medicine

## 2022-08-30 ENCOUNTER — Telehealth (HOSPITAL_COMMUNITY): Payer: Self-pay | Admitting: Emergency Medicine

## 2022-08-30 DIAGNOSIS — R079 Chest pain, unspecified: Secondary | ICD-10-CM

## 2022-08-30 MED ORDER — METOPROLOL TARTRATE 100 MG PO TABS: 100.0000 mg | ORAL_TABLET | Freq: Once | ORAL | 0 refills | Status: DC

## 2022-08-30 NOTE — Telephone Encounter (Signed)
Reaching out to patient to offer assistance regarding upcoming cardiac imaging study; pt verbalizes understanding of appt date/time, parking situation and where to check in, pre-test NPO status and medications ordered, and verified current allergies; name and call back number provided for further questions should they arise Marchia Bond RN Navigator Cardiac Imaging [facility] [phone] office [phone] cell  Arrival 800 WC entrance 100mg  metoprolol tartrate  Denies iv issues Aware contrast/nitro

## 2022-08-31 ENCOUNTER — Ambulatory Visit (HOSPITAL_COMMUNITY)
Admission: RE | Admit: 2022-08-31 | Discharge: 2022-08-31 | Disposition: A | Discharge status: Home / Self Care | Payer: BC Managed Care – PPO | Source: Ambulatory Visit | Attending: Family Medicine | Admitting: Family Medicine

## 2022-08-31 DIAGNOSIS — R079 Chest pain, unspecified: Secondary | ICD-10-CM | POA: Diagnosis present

## 2022-08-31 DIAGNOSIS — I2510 Atherosclerotic heart disease of native coronary artery without angina pectoris: Secondary | ICD-10-CM

## 2022-08-31 MED ORDER — IOHEXOL 350 MG/ML SOLN
100.0000 mL | Freq: Once | INTRAVENOUS | Status: AC | PRN
  Administered 2022-08-31: 100 mL via INTRAVENOUS

## 2022-08-31 MED ORDER — NITROGLYCERIN 0.4 MG SL SUBL
0.8000 mg | SUBLINGUAL_TABLET | Freq: Once | SUBLINGUAL | Status: AC
  Administered 2022-08-31: 0.8 mg via SUBLINGUAL

## 2022-08-31 MED ORDER — NITROGLYCERIN 0.4 MG SL SUBL
SUBLINGUAL_TABLET | SUBLINGUAL | Status: AC
  Filled 2022-08-31: qty 2

## 2022-09-17 ENCOUNTER — Encounter: Payer: Self-pay | Admitting: Cardiology

## 2022-09-17 ENCOUNTER — Ambulatory Visit: Payer: BC Managed Care – PPO | Attending: Cardiology | Admitting: Cardiology

## 2022-09-17 VITALS — BP 142/100 | HR 78 | Ht 71.0 in | Wt 269.1 lb

## 2022-09-17 DIAGNOSIS — I472 Ventricular tachycardia, unspecified: Secondary | ICD-10-CM | POA: Diagnosis not present

## 2022-09-17 NOTE — Patient Instructions (Signed)
Medication Instructions:  Your physician recommends that you continue on your current medications as directed. Please refer to the Current Medication list given to you today.  *If you need a refill on your cardiac medications before your next appointment, please call your pharmacy*   Lab Work: None ordered   Testing/Procedures: None ordered   Follow-Up: At [facility], you and your health needs are our priority.  As part of our continuing mission to provide you with exceptional heart care, we have created designated Provider Care Teams.  These Care Teams include your primary Cardiologist (physician) and Advanced Practice Providers (APPs -  Physician Assistants and Nurse Practitioners) who all work together to provide you with the care you need, when you need it.  Your next appointment:   as  needed  The format for your next appointment:   In Person  Provider:   Allegra Lai, MD    Thank you for choosing [facility]!!   Trinidad Curet, RN [phone]

## 2022-09-17 NOTE — Progress Notes (Signed)
Electrophysiology Office Note   Date:  09/17/2022   ID:  Nathaniel Hall, DOB 26-Jul-1966, MRN [id number]  PCP:  Rudene Anda, MD  Cardiologist:  Claudie Leach Primary Electrophysiologist:  Sanae Willetts Meredith Leeds, MD    Chief Complaint: VT   History of Present Illness: Nathaniel Hall is a 57 y.o. male who is being seen today for the evaluation of VT at the request of Franchot Mimes, MD. Presenting today for electrophysiology evaluation.  He has a history significant for diabetes, hypertension, hyperlipidemia, sleep apnea, TIA.  He wore a cardiac monitor that showed episodes of ventricular tachycardia.  He has since had an echo without major abnormality and a coronary CTA without evidence of coronary artery disease and a coronary calcium score of 45.  He states that the cardiac monitor was placed as he has a strong family history of cardiac disease.  Both of his parents had heart attacks in the 42s.  His father also has atrial fibrillation.  He was concerned that he had risk of atrial fibrillation and thus the monitor was placed.  Today, he denies symptoms of palpitations, chest pain, shortness of breath, orthopnea, PND, lower extremity edema, claudication, dizziness, presyncope, syncope, bleeding, or neurologic sequela. The patient is tolerating medications without difficulties.    Past Medical History:  Diagnosis Date   Depression    Diabetes mellitus without complication ([place])    Headache    Hypercholesteremia    Hypertension    Sleep apnea    TIA (transient ischemic attack)    x 2   Past Surgical History:  Procedure Laterality Date   EXTRACORPOREAL SHOCK WAVE LITHOTRIPSY Right 03/07/2020   Procedure: EXTRACORPOREAL SHOCK WAVE LITHOTRIPSY (ESWL);  Surgeon: Festus Aloe, MD;  Location: [facility];  Service: Urology;  Laterality: Right;   EXTRACORPOREAL SHOCK WAVE LITHOTRIPSY Left 08/14/2021   Procedure: EXTRACORPOREAL SHOCK WAVE LITHOTRIPSY (ESWL);   Surgeon: Alexis Frock, MD;  Location: [facility];  Service: Urology;  Laterality: Left;  ONLY NEEDS 75 MIN     Current Outpatient Medications  Medication Sig Dispense Refill   amLODipine (NORVASC) 5 MG tablet Take 5 mg by mouth daily.     aspirin 81 MG chewable tablet Chew 81 mg daily by mouth.     atorvastatin (LIPITOR) 10 MG tablet Take 10 mg daily by mouth.     eletriptan (RELPAX) 40 MG tablet Take 40 mg by mouth as needed for migraine or headache. May repeat in 2 hours if headache persists or recurs.     ergocalciferol (VITAMIN D2) 1.25 MG (50000 UT) capsule Take 1 capsule by mouth once a week.     indapamide (LOZOL) 1.25 MG tablet Take 1.25 mg by mouth daily.     MAGNESIUM PO Take 1 tablet by mouth daily.     metFORMIN (GLUCOPHAGE) 500 MG tablet Take 500 mg by mouth 2 (two) times daily with a meal.     metoprolol succinate (TOPROL-XL) 25 MG 24 hr tablet Take 25 mg by mouth daily.     tamsulosin (FLOMAX) 0.4 MG CAPS capsule Take 1 capsule (0.4 mg total) daily by mouth. 30 capsule 0   No current facility-administered medications for this visit.    Allergies:   Oxycodone-acetaminophen and Sertraline   Social History:  The patient  reports that he has never smoked. He has never used smokeless tobacco. He reports that he does not drink alcohol and does not use drugs.   Family History:  The patient's family history includes  Cancer in his maternal grandfather, maternal grandmother, and mother; Heart attack in his maternal grandmother and mother; Heart disease in his father.    ROS:  Please see the history of present illness.   Otherwise, review of systems is positive for none.   All other systems are reviewed and negative.    PHYSICAL EXAM: VS:  BP (!) 142/100   Pulse 78   Ht 5' 11"$  (1.803 m)   Wt 269 lb 1.9 oz (122.1 kg)   SpO2 94%   BMI 37.53 kg/m  , BMI Body mass index is 37.53 kg/m. GEN: Well nourished, well developed, in no acute distress  HEENT: normal   Neck: no JVD, carotid bruits, or masses Cardiac: RRR; no murmurs, rubs, or gallops,no edema  Respiratory:  clear to auscultation bilaterally, normal work of breathing GI: soft, nontender, nondistended, + BS MS: no deformity or atrophy  Skin: warm and dry Neuro:  Strength and sensation are intact Psych: euthymic mood, full affect  EKG:  EKG is ordered today. Personal review of the ekg ordered shows sinus rhythm  Recent Labs: No results found for requested labs within last 365 days.    Lipid Panel  No results found for: "CHOL", "TRIG", "HDL", "CHOLHDL", "VLDL", "LDLCALC", "LDLDIRECT"   Wt Readings from Last 3 Encounters:  09/17/22 269 lb 1.9 oz (122.1 kg)  08/14/21 260 lb 9.6 oz (118.2 kg)  07/01/21 242 lb 11.6 oz (110.1 kg)      Other studies Reviewed: Additional studies/ records that were reviewed today include: Coronary CTA 08/31/2022  Review of the above records today demonstrates:  1. Coronary calcium score of 45.1. This was 77th percentile for age-, sex, and race-matched controls.   2.  Normal coronary origin with right dominance.   3. Mild atherosclerosis of the ostial LAD and proximal LCx. CAD RADS 2.   4.  Recommend preventive therapy and risk factor modification.  TTE 06/05/2022 Ejection fraction 60 to 65% Left atrium moderately dilated Right atrium normal in size and function Structurally normal aortic valve Normal appearing mitral valve with normal function  ASSESSMENT AND PLAN:  1.  Ventricular tachycardia: Was found on cardiac monitor.  Had a 7 beat run of nonsustained VT.  He currently feels well.  He was asymptomatic from the episode.  Additionally, he has had an echo with a normal ejection fraction, and a cardiac CT without evidence of obstructive coronary artery disease and a low coronary calcium score.  Due to this, and minimal symptoms, would hold off on further therapy.  If he does develop symptoms of palpitations that can be correlated to VT, would  likely start him on a beta-blocker.    Current medicines are reviewed at length with the patient today.   The patient does not have concerns regarding his medicines.  The following changes were made today:  none  Labs/ tests ordered today include:  Orders Placed This Encounter  Procedures   EKG 12-Lead     Disposition:   FU with Timmya Blazier  PRN  Signed, Shyasia Funches Meredith Leeds, MD  09/17/2022 3:32 PM     [facility] [address] [place] [address] [phone] (office) [phone] (fax)
